# Patient Record
Sex: Male | Born: 1955 | Race: Black or African American | Hispanic: No | Smoking: Never smoker
Health system: Southern US, Community
[De-identification: ages and names within clinical notes are randomized; demographics above are authoritative.]

## PROBLEM LIST (undated history)

## (undated) DIAGNOSIS — C801 Malignant (primary) neoplasm, unspecified: Secondary | ICD-10-CM

## (undated) HISTORY — PX: TONSILLECTOMY: SUR1361

## (undated) HISTORY — PX: PROSTATE SURGERY: SHX751

---

## 1998-09-03 ENCOUNTER — Emergency Department (HOSPITAL_COMMUNITY): Admission: EM | Admit: 1998-09-03 | Discharge: 1998-09-03 | Payer: Self-pay | Admitting: Emergency Medicine

## 1999-01-13 ENCOUNTER — Emergency Department (HOSPITAL_COMMUNITY): Admission: EM | Admit: 1999-01-13 | Discharge: 1999-01-13 | Payer: Self-pay | Admitting: Pulmonary Disease

## 1999-09-03 ENCOUNTER — Emergency Department (HOSPITAL_COMMUNITY): Admission: EM | Admit: 1999-09-03 | Discharge: 1999-09-04 | Payer: Self-pay | Admitting: Emergency Medicine

## 2000-10-17 ENCOUNTER — Encounter: Payer: Self-pay | Admitting: Emergency Medicine

## 2000-10-17 ENCOUNTER — Emergency Department (HOSPITAL_COMMUNITY): Admission: EM | Admit: 2000-10-17 | Discharge: 2000-10-17 | Payer: Self-pay | Admitting: Emergency Medicine

## 2002-06-12 ENCOUNTER — Emergency Department (HOSPITAL_COMMUNITY): Admission: EM | Admit: 2002-06-12 | Discharge: 2002-06-12 | Payer: Self-pay | Admitting: *Deleted

## 2003-01-14 ENCOUNTER — Encounter: Payer: Self-pay | Admitting: Emergency Medicine

## 2003-01-14 ENCOUNTER — Emergency Department (HOSPITAL_COMMUNITY): Admission: EM | Admit: 2003-01-14 | Discharge: 2003-01-14 | Payer: Self-pay | Admitting: Emergency Medicine

## 2003-01-15 ENCOUNTER — Encounter: Payer: Self-pay | Admitting: Emergency Medicine

## 2003-01-15 ENCOUNTER — Emergency Department (HOSPITAL_COMMUNITY): Admission: EM | Admit: 2003-01-15 | Discharge: 2003-01-15 | Payer: Self-pay | Admitting: Emergency Medicine

## 2003-10-10 ENCOUNTER — Emergency Department (HOSPITAL_COMMUNITY): Admission: EM | Admit: 2003-10-10 | Discharge: 2003-10-10 | Payer: Self-pay | Admitting: Emergency Medicine

## 2008-12-01 ENCOUNTER — Ambulatory Visit (HOSPITAL_COMMUNITY): Admission: RE | Admit: 2008-12-01 | Discharge: 2008-12-01 | Payer: Self-pay | Admitting: Urology

## 2009-01-29 ENCOUNTER — Inpatient Hospital Stay (HOSPITAL_COMMUNITY): Admission: RE | Admit: 2009-01-29 | Discharge: 2009-01-30 | Payer: Self-pay | Admitting: Urology

## 2009-01-29 ENCOUNTER — Encounter (INDEPENDENT_AMBULATORY_CARE_PROVIDER_SITE_OTHER): Payer: Self-pay | Admitting: Urology

## 2009-10-19 ENCOUNTER — Emergency Department (HOSPITAL_COMMUNITY): Admission: EM | Admit: 2009-10-19 | Discharge: 2009-10-19 | Payer: Self-pay | Admitting: Emergency Medicine

## 2011-01-12 ENCOUNTER — Emergency Department (HOSPITAL_COMMUNITY)
Admission: EM | Admit: 2011-01-12 | Discharge: 2011-01-12 | Disposition: A | Discharge status: Home / Self Care | Payer: 59 | Attending: Emergency Medicine | Admitting: Emergency Medicine

## 2011-01-12 DIAGNOSIS — H5789 Other specified disorders of eye and adnexa: Secondary | ICD-10-CM | POA: Insufficient documentation

## 2011-01-12 DIAGNOSIS — H1130 Conjunctival hemorrhage, unspecified eye: Secondary | ICD-10-CM | POA: Insufficient documentation

## 2011-01-15 LAB — HEMOGLOBIN AND HEMATOCRIT, BLOOD
HCT: 43.1 % (ref 39.0–52.0)
Hemoglobin: 13.1 g/dL (ref 13.0–17.0)

## 2011-01-15 LAB — BASIC METABOLIC PANEL
BUN: 16 mg/dL (ref 6–23)
CO2: 31 mEq/L (ref 19–32)
Calcium: 9.2 mg/dL (ref 8.4–10.5)
Chloride: 105 mEq/L (ref 96–112)
Creatinine, Ser: 0.99 mg/dL (ref 0.4–1.5)
GFR calc Af Amer: >60 mL/min (ref >60)
GFR calc non Af Amer: >60 mL/min (ref >60)
Glucose, Bld: 100 mg/dL — ABNORMAL HIGH (ref 70–99)
Potassium: 3.9 mEq/L (ref 3.5–5.1)
Sodium: 142 mEq/L (ref 135–145)

## 2011-01-15 LAB — CBC
HCT: 43.4 % (ref 39.0–52.0)
Hemoglobin: 14.6 g/dL (ref 13.0–17.0)
MCHC: 33.7 g/dL (ref 30.0–36.0)
Platelets: 200 10*3/uL (ref 150–400)
RDW: 13.3 % (ref 11.5–15.5)

## 2011-02-18 NOTE — Op Note (Signed)
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   POSTOPERATIVE DIAGNOSIS:  Clinically localized adenocarcinoma of  prostate (clinical stage T2a N0 M0).   PROCEDURE:  1. Robotic-assisted laparoscopic radical prostatectomy (left nerve      sparing).  2. Bilateral laparoscopic pelvic lymphadenectomy.   SURGEON:  Dr. Heloise Purpura.   FIRST ASSISTANT:  Delia Chimes, nurse practitioner.   SECOND ASSISTANT:  Dr. Georgeanna Lea.   ANESTHESIA:  General.   COMPLICATIONS:  None.   ESTIMATED BLOOD LOSS:  225 mL.   INTRAVENOUS FLUIDS:  1000 mL of lactated Ringer's.   SPECIMENS:  1. Prostate and seminal vesicles.  2. Right pelvic lymph nodes.  3. Left pelvic lymph nodes.   DISPOSITION OF SPECIMENS:  To pathology.   DRAINS:  1. A 20-French coude catheter.  2. A #19 Blake pelvic drain.   INDICATION:  Cody [name] is a 55 year old gentleman who was found to have  an increasing PSA.  He had originally undergone a prostate biopsy in  2006 which was negative.  His PSA continued to increase over the past  few years, and he was reevaluated by Dr. Aldean Ast, and a repeat biopsy  indicated clinically localized adenocarcinoma of the prostate.  He did  have findings on a CT scan which suggested a small sclerotic focus in  the left ilium as well as in the sacrum and pubis.  However, these were  felt to represent benign bone islands, and bone scan did not demonstrate  uptake in these areas.  The potential risks, complications, and  alternative treatment options were discussed in detail, and  informed  consent was obtained.   DESCRIPTION OF PROCEDURE:  The patient was taken to the operating room,  and a general anesthetic was administered.  He was given preoperative  antibiotics, placed in the dorsal lithotomy position, and prepped and  draped in the usual sterile fashion.  Next, a preoperative time-out was  performed.  A Foley catheter was then inserted into the bladder.  A site  was selected just superior to the umbilicus for placement of the camera  port.  This was placed using a standard open Hassan technique which  allowed entry in the peritoneal cavity under direct vision without  difficulty.  A 12-mm port was then placed, and a pneumoperitoneum was  established.  A 0 degrees lens was used to inspect the abdomen, and  there was no evidence of any intra-abdominal injuries or other  abnormalities.  The remaining ports were then placed with bilateral 8-mm  robotic ports placed approximately 10 cm lateral to and just inferior to  the camera port site.  An additional 8-mm robotic port was placed  in the  far left lateral abdominal wall.  A 5-mm port was placed between the  camera port and the right robotic port, and a 12-mm port was placed in  the far right lateral abdominal wall for laparoscopic assistance.  All  ports were placed under direct vision without difficulty.  The surgical  cart was then docked.  With the aid of the cautery scissors, the bladder  was reflected posteriorly allowing entry into the space of Retzius and  identification of the endopelvic fascia and prostate.  The endopelvic  fascia was then incised from the apex back to the base of the prostate  bilaterally, and the underlying levator muscle fibers were swept  laterally off the prostate, thereby isolating the dorsal venous complex.  The dorsal vein was then stapled and divided with a 45-mm flex ETS  stapler.  The bladder neck was identified with the aid of Foley catheter  manipulation was divided  anteriorly, thereby exposing the catheter.  The  catheter balloon was deflated, and the catheter was brought into the  operative field and used to retract the prostate anteriorly.  This  exposed the posterior bladder neck which was then divided, and  dissection proceeded between the bladder neck and prostate until the  vasa deferentia and seminal vesicles were identified.  The vasa  deferentia were isolated, divided and lifted anteriorly.  The seminal  vesicles were then dissected down to their tips with care to control  seminal vesicle arterial blood supply.  There were then lifted  anteriorly, and the space between Denonvilliers fascia and the anterior  rectum was bluntly developed, thereby isolating the vascular pedicles of  the prostate.  On the left side, the lateral prostatic fascia was  incised, releasing the neurovascular bundle, and the pedicle was ligated  with Hem-o-lok clips above the level of the neurovascular bundles.  The  neurovascular bundle was released off the apex of prostate and urethra.  On the right side, a wide non-nerve sparing prostatectomy was performed,  with Hem-o-lok clips also used for hemostasis along the vascular  pedicle.  The urethra was then sharply divided allowing the prostate  specimen to be disarticulated.  The pelvis was copiously irrigated, and  there was no evidence for any rectal injury.  Hemostasis was ensured.  Attention then turned the right pelvic sidewall.  The fibrofatty tissue  between the external iliac vein, confluence of the iliac vessels,  hypogastric artery, and Cooper's ligament was dissected free from the  pelvic sidewall with care to preserve the obturator nerve.  Hem-o-lok  clips were used for lymphostasis and hemostasis.  An identical procedure  was performed on the contralateral side.  Both lymphatic packets were  removed for permanent pathologic analysis.  Attention then turned at the  urethral anastomosis.  A 2-0 Vicryl slip  knot was placed between  Denonvilliers fascia, the posterior bladder neck and posterior urethra  to reapproximate these structures.  A double-armed 3-0 Monocryl suture  was then used to perform a 360 degrees running tension-free anastomosis  between the bladder neck and urethra.  A new 20-French coude catheter  was inserted into the bladder and irrigated.  There were no blood clots  within the bladder, and the anastomosis appeared to be watertight.  A  #19 Blake drain was brought through the left robotic port and  appropriately positioned in the pelvis.  It was secured to skin with a  nylon suture.  The surgical cart was then docked.  With the aid  of the  suture passer device, the right lateral 12-mm port site was then closed  with a 0 Vicryl suture.  All remaining ports were removed under direct  vision.  The prostate specimen was removed intact via the periumbilical  port site within the Endopouch retrieval bag.  This fascial opening was  closed with a running 0  Vicryl suture.  All ports were injected with 0.25% Marcaine and  reapproximated at the skin level with staples.  Sterile dressings were  applied.  The patient appeared to tolerate procedure well without  complications.  He was able to be extubated and transferred to the  recovery unit in satisfactory condition.      Heloise Purpura, MD  Electronically Signed     LB/MEDQ  D:  01/29/2009  T:  01/29/2009  Job:  [id number]

## 2014-02-21 ENCOUNTER — Ambulatory Visit: Payer: 59

## 2015-02-05 ENCOUNTER — Ambulatory Visit
Admission: RE | Admit: 2015-02-05 | Discharge: 2015-02-05 | Disposition: A | Discharge status: Home / Self Care | Payer: 59 | Source: Ambulatory Visit | Attending: Family Medicine | Admitting: Family Medicine

## 2015-02-05 ENCOUNTER — Other Ambulatory Visit: Payer: Self-pay | Admitting: Family Medicine

## 2015-02-05 DIAGNOSIS — R1030 Lower abdominal pain, unspecified: Secondary | ICD-10-CM

## 2015-02-05 MED ORDER — IOHEXOL 300 MG/ML  SOLN
100.0000 mL | Freq: Once | INTRAMUSCULAR | Status: AC | PRN
  Administered 2015-02-05: 100 mL via INTRAVENOUS

## 2015-09-19 ENCOUNTER — Other Ambulatory Visit (HOSPITAL_COMMUNITY): Payer: Self-pay | Admitting: Urology

## 2015-09-19 DIAGNOSIS — C61 Malignant neoplasm of prostate: Secondary | ICD-10-CM

## 2015-10-03 ENCOUNTER — Encounter (HOSPITAL_COMMUNITY)
Admission: RE | Admit: 2015-10-03 | Discharge: 2015-10-03 | Disposition: A | Discharge status: Home / Self Care | Payer: 59 | Source: Ambulatory Visit | Attending: Urology | Admitting: Urology

## 2015-10-03 DIAGNOSIS — C61 Malignant neoplasm of prostate: Secondary | ICD-10-CM | POA: Insufficient documentation

## 2015-10-03 MED ORDER — TECHNETIUM TC 99M MEDRONATE IV KIT
25.7000 | PACK | Freq: Once | INTRAVENOUS | Status: AC | PRN
  Administered 2015-10-03: 25.7 via INTRAVENOUS

## 2015-12-10 DIAGNOSIS — B029 Zoster without complications: Secondary | ICD-10-CM | POA: Insufficient documentation

## 2015-12-10 DIAGNOSIS — B078 Other viral warts: Secondary | ICD-10-CM | POA: Insufficient documentation

## 2015-12-10 DIAGNOSIS — R61 Generalized hyperhidrosis: Secondary | ICD-10-CM | POA: Insufficient documentation

## 2015-12-10 DIAGNOSIS — N529 Male erectile dysfunction, unspecified: Secondary | ICD-10-CM | POA: Insufficient documentation

## 2016-05-08 ENCOUNTER — Encounter (HOSPITAL_COMMUNITY): Payer: Self-pay | Admitting: Emergency Medicine

## 2016-05-08 ENCOUNTER — Emergency Department (HOSPITAL_COMMUNITY): Payer: BLUE CROSS/BLUE SHIELD

## 2016-05-08 ENCOUNTER — Emergency Department (HOSPITAL_COMMUNITY)
Admission: EM | Admit: 2016-05-08 | Discharge: 2016-05-08 | Disposition: A | Discharge status: Home / Self Care | Payer: BLUE CROSS/BLUE SHIELD | Attending: Emergency Medicine | Admitting: Emergency Medicine

## 2016-05-08 DIAGNOSIS — Z8546 Personal history of malignant neoplasm of prostate: Secondary | ICD-10-CM | POA: Diagnosis not present

## 2016-05-08 DIAGNOSIS — Z791 Long term (current) use of non-steroidal anti-inflammatories (NSAID): Secondary | ICD-10-CM | POA: Diagnosis not present

## 2016-05-08 DIAGNOSIS — Z79899 Other long term (current) drug therapy: Secondary | ICD-10-CM | POA: Insufficient documentation

## 2016-05-08 DIAGNOSIS — N200 Calculus of kidney: Secondary | ICD-10-CM | POA: Diagnosis not present

## 2016-05-08 DIAGNOSIS — R1012 Left upper quadrant pain: Secondary | ICD-10-CM | POA: Diagnosis present

## 2016-05-08 HISTORY — DX: Malignant (primary) neoplasm, unspecified: C80.1

## 2016-05-08 LAB — COMPREHENSIVE METABOLIC PANEL
ALBUMIN: 4.4 g/dL (ref 3.5–5.0)
ALK PHOS: 87 U/L (ref 38–126)
ALT: 28 U/L (ref 17–63)
ANION GAP: 10 (ref 5–15)
AST: 32 U/L (ref 15–41)
BILIRUBIN TOTAL: 0.7 mg/dL (ref 0.3–1.2)
BUN: 22 mg/dL — ABNORMAL HIGH (ref 6–20)
CALCIUM: 9.4 mg/dL (ref 8.9–10.3)
CO2: 26 mmol/L (ref 22–32)
Chloride: 104 mmol/L (ref 101–111)
Creatinine, Ser: 1.19 mg/dL (ref 0.61–1.24)
GLUCOSE: 111 mg/dL — AB (ref 65–99)
POTASSIUM: 3.7 mmol/L (ref 3.5–5.1)
Sodium: 140 mmol/L (ref 135–145)
TOTAL PROTEIN: 7.8 g/dL (ref 6.5–8.1)

## 2016-05-08 LAB — URINALYSIS, ROUTINE W REFLEX MICROSCOPIC
BILIRUBIN URINE: NEGATIVE
Glucose, UA: NEGATIVE mg/dL
LEUKOCYTES UA: NEGATIVE
NITRITE: NEGATIVE
Protein, ur: NEGATIVE mg/dL
SPECIFIC GRAVITY, URINE: 1.028 (ref 1.005–1.030)
pH: 6 (ref 5.0–8.0)

## 2016-05-08 LAB — URINE MICROSCOPIC-ADD ON

## 2016-05-08 LAB — CBC
HEMATOCRIT: 39.5 % (ref 39.0–52.0)
HEMOGLOBIN: 13.5 g/dL (ref 13.0–17.0)
MCH: 29.6 pg (ref 26.0–34.0)
MCHC: 34.2 g/dL (ref 30.0–36.0)
MCV: 86.6 fL (ref 78.0–100.0)
Platelets: 229 10*3/uL (ref 150–400)
RBC: 4.56 MIL/uL (ref 4.22–5.81)
RDW: 12.6 % (ref 11.5–15.5)
WBC: 12.9 10*3/uL — AB (ref 4.0–10.5)

## 2016-05-08 LAB — LIPASE, BLOOD: Lipase: 13 U/L (ref 11–51)

## 2016-05-08 MED ORDER — MORPHINE SULFATE (PF) 4 MG/ML IV SOLN
4.0000 mg | INTRAVENOUS | Status: DC | PRN
  Administered 2016-05-08: 4 mg via INTRAVENOUS
  Filled 2016-05-08: qty 1

## 2016-05-08 MED ORDER — HYDROCODONE-ACETAMINOPHEN 5-325 MG PO TABS: 1.0000 | ORAL_TABLET | ORAL | 0 refills | Status: DC | PRN

## 2016-05-08 MED ORDER — TAMSULOSIN HCL 0.4 MG PO CAPS: 0.4000 mg | ORAL_CAPSULE | Freq: Every day | ORAL | 0 refills | Status: DC

## 2016-05-08 MED ORDER — TAMSULOSIN HCL 0.4 MG PO CAPS
0.4000 mg | ORAL_CAPSULE | Freq: Once | ORAL | Status: AC
  Administered 2016-05-08: 0.4 mg via ORAL
  Filled 2016-05-08: qty 1

## 2016-05-08 MED ORDER — ONDANSETRON 4 MG PO TBDP: 4.0000 mg | ORAL_TABLET | Freq: Three times a day (TID) | ORAL | 0 refills | Status: DC | PRN

## 2016-05-08 MED ORDER — ONDANSETRON HCL 4 MG/2ML IJ SOLN
4.0000 mg | Freq: Once | INTRAMUSCULAR | Status: AC
  Administered 2016-05-08: 4 mg via INTRAVENOUS
  Filled 2016-05-08: qty 2

## 2016-05-08 NOTE — Discharge Instructions (Signed)
Flomax once per day until you pass your kidney stone in your pain resolves.  Vicodin as needed for pain. Do not work or drive when taking.  Follow-up with [facility]. Call for an appointment if you have not pass the stone within the next 5 days.

## 2016-05-08 NOTE — ED Notes (Signed)
Patient transported to CT 

## 2016-05-08 NOTE — ED Provider Notes (Signed)
[facility] DEPT Provider Note   CSN: [id number] Arrival date & time: 05/08/16  1423  First Provider Contact:  First MD Initiated Contact with Patient 05/08/16 1613        History   Chief Complaint Chief Complaint  Patient presents with  . Abdominal Pain  . Flank Pain    HPI Cody [name] is a 60 y.o. male. He has h/o Prostate Ca s/p Ctx, last dose 02/2016.  Left flank and LUQ AP since 04:00am.  Nausea without vomiting. He states his pain was maximum intensity at onset awaken him from sleep this morning. Sever had a kidney stone. He denies any urinary symptoms. No noticeable gross hematuria.  His history of prostatectomy. His PSA began to rise and December. He was restarted on "injections every 4 months" according to his "cancer doctor". Last was in May  HPI  Past Medical History:  Diagnosis Date  . Cancer [facility])    Prostate    There are no active problems to display for this patient.   Past Surgical History:  Procedure Laterality Date  . PROSTATE SURGERY    . TONSILLECTOMY         Home Medications    Prior to Admission medications   Medication Sig Start Date End Date Taking? Authorizing Provider  acetaminophen (TYLENOL) 500 MG tablet Take 1,000 mg by mouth every 6 (six) hours as needed for moderate pain.   Yes Historical Provider, MD  cholecalciferol (VITAMIN D) 1000 units tablet Take 1,000 Units by mouth 2 (two) times daily.   Yes Historical Provider, MD  HYDROcodone-acetaminophen (NORCO/VICODIN) 5-325 MG tablet Take 1 tablet by mouth every 4 (four) hours as needed. 05/08/16   Tanna Furry, MD  ondansetron (ZOFRAN ODT) 4 MG disintegrating tablet Take 1 tablet (4 mg total) by mouth every 8 (eight) hours as needed for nausea. 05/08/16   Tanna Furry, MD  tamsulosin (FLOMAX) 0.4 MG CAPS capsule Take 1 capsule (0.4 mg total) by mouth daily. 05/08/16   Tanna Furry, MD    Family History No family history on file.  Social History Social History  Substance Use Topics  .  Smoking status: Never Smoker  . Smokeless tobacco: Never Used  . Alcohol use Yes     Allergies   Review of patient's allergies indicates no known allergies.   Review of Systems Review of Systems  Constitutional: Negative for appetite change, chills, diaphoresis, fatigue and fever.  HENT: Negative for mouth sores, sore throat and trouble swallowing.   Eyes: Negative for visual disturbance.  Respiratory: Negative for cough, chest tightness, shortness of breath and wheezing.   Cardiovascular: Negative for chest pain.  Gastrointestinal: Positive for abdominal pain and nausea. Negative for abdominal distention, diarrhea and vomiting.  Endocrine: Negative for polydipsia, polyphagia and polyuria.  Genitourinary: Positive for flank pain. Negative for dysuria, frequency and hematuria.  Musculoskeletal: Negative for gait problem.  Skin: Negative for color change, pallor and rash.  Neurological: Negative for dizziness, syncope, light-headedness and headaches.  Hematological: Does not bruise/bleed easily.  Psychiatric/Behavioral: Negative for behavioral problems and confusion.     Physical Exam Updated Vital Signs BP 149/79 (BP Location: Left Arm)   Pulse (!) 57   Temp 98.3 F (36.8 C) (Oral)   Resp 18   SpO2 100%   Physical Exam  Constitutional: He is oriented to person, place, and time. He appears well-developed and well-nourished. No distress.  HENT:  Head: Normocephalic.  Eyes: Conjunctivae are normal. Pupils are equal, round, and reactive to  light. No scleral icterus.  Neck: Normal range of motion. Neck supple. No thyromegaly present.  Cardiovascular: Normal rate and regular rhythm.  Exam reveals no gallop and no friction rub.   No murmur heard. Pulmonary/Chest: Effort normal and breath sounds normal. No respiratory distress. He has no wheezes. He has no rales.  Abdominal: Soft. Bowel sounds are normal. He exhibits no distension. There is no tenderness. There is no rebound.     Musculoskeletal: Normal range of motion.       Back:  Neurological: He is alert and oriented to person, place, and time.  Skin: Skin is warm and dry. No rash noted.  Psychiatric: He has a normal mood and affect. His behavior is normal.     ED Treatments / Results  Labs (all labs ordered are listed, but only abnormal results are displayed) Labs Reviewed  COMPREHENSIVE METABOLIC PANEL - Abnormal; Notable for the following:       Result Value   Glucose, Bld 111 (*)    BUN 22 (*)    All other components within normal limits  CBC - Abnormal; Notable for the following:    WBC 12.9 (*)    All other components within normal limits  URINALYSIS, ROUTINE W REFLEX MICROSCOPIC (NOT AT [facility]) - Abnormal; Notable for the following:    Hgb urine dipstick MODERATE (*)    Ketones, ur >80 (*)    All other components within normal limits  URINE MICROSCOPIC-ADD ON - Abnormal; Notable for the following:    Squamous Epithelial / LPF 0-5 (*)    Bacteria, UA RARE (*)    All other components within normal limits  LIPASE, BLOOD    EKG  EKG Interpretation None       Radiology Ct Renal Stone Study  Result Date: 05/08/2016 CLINICAL DATA:  LEFT upper quadrant abdominal pain, LEFT flank pain, onset of symptoms earlier today, nausea, history prostate cancer EXAM: CT ABDOMEN AND PELVIS WITHOUT CONTRAST TECHNIQUE: Multidetector CT imaging of the abdomen and pelvis was performed following the standard protocol without IV contrast. Sagittal and coronal MPR images reconstructed from axial data set. Oral contrast was administered. COMPARISON:  10/03/2015 FINDINGS: Lower chest:  Lung bases clear. Hepatobiliary: Multiple hepatic cysts largest lateral segment LEFT lobe liver 15 x 12 mm. Gallbladder and liver otherwise normal appearance Pancreas: Normal appearance Spleen: Normal appearance Adrenals/Urinary Tract: Mild thickening adrenal glands stable. Normal appearing RIGHT kidney and RIGHT ureter. Mild LEFT  hydronephrosis and hydroureter secondary to a 1-2 mm diameter calculus at the LEFT ureterovesical junction. Bladder otherwise unremarkable. Prostate gland surgically absent. Stomach/Bowel: Normal appearing retrocecal appendix. Small mild high density is seen within the stomach, small bowel and RIGHT colon, could represent contrast or radiodense medication. Vascular/Lymphatic: Atherosclerotic calcification in aorta and coronary arteries. Normal to upper normal sized external iliac lymph nodes stable. No definite abdominal or pelvic adenopathy. Reproductive: N/A Other: No free air or free fluid.  No hernia. Musculoskeletal: Bones unremarkable. IMPRESSION: LEFT hydronephrosis and hydroureter secondary to a 1-2 mm diameter LEFT UPJ calculus. Grossly stable hepatic cysts. No other definite intra-abdominal or intrapelvic abnormalities. Aortic atherosclerosis and coronary arterial calcification. Electronically Signed   By: Lavonia Dana M.D.   On: 05/08/2016 17:24    Procedures Procedures (including critical care time)  Medications Ordered in ED Medications  morphine 4 MG/ML injection 4 mg (4 mg Intravenous Given 05/08/16 1726)  tamsulosin (FLOMAX) capsule 0.4 mg (not administered)  ondansetron (ZOFRAN) injection 4 mg (4 mg Intravenous Given 05/08/16 1726)  Initial Impression / Assessment and Plan / ED Course  I have reviewed the triage vital signs and the nursing notes.  Pertinent labs & imaging results that were available during my care of the patient were reviewed by me and considered in my medical decision making (see chart for details).  Clinical Course    Left flank pain. No tenderness. Does not appear as symptomatic as I would expect with ureteral stone. However he does rate his pain as a "10 over 10". He has no reproducible tenderness. Clear lungs. Symptoms are clearly abdominal and not pulmonary. Not hypoxemic or tachycardic. Hematuria could be secondary to stone, infection, or known prostate  cancer, although patient has had prostatectomy.  He does not know if this was complete or cannot describe the nature of it to me.  Note: Per chart, a radical prostatetectomy with pelvic LND in 2012.  Final Clinical Impressions(s) / ED Diagnoses   Final diagnoses:  Kidney stone   Patient had complete relief of pain. CT scan shows small distal UVJ stone just at the far distal end of the ureter. Plan is home, given Flomax here. Push fluids stay hydrated. When necessary pain medication. Urological follow-up if not improving with his urologist Dr. Brayton Caves Prescriptions New Prescriptions   HYDROCODONE-ACETAMINOPHEN (NORCO/VICODIN) 5-325 MG TABLET    Take 1 tablet by mouth every 4 (four) hours as needed.   ONDANSETRON (ZOFRAN ODT) 4 MG DISINTEGRATING TABLET    Take 1 tablet (4 mg total) by mouth every 8 (eight) hours as needed for nausea.   TAMSULOSIN (FLOMAX) 0.4 MG CAPS CAPSULE    Take 1 capsule (0.4 mg total) by mouth daily.     Tanna Furry, MD 05/08/16 [phone]

## 2016-05-08 NOTE — ED Notes (Signed)
Attempted blood draw with no success.     

## 2016-05-08 NOTE — ED Triage Notes (Addendum)
Patient presents for LUQ abdominal pain and left flank pain starting earlier today. Reports nausea. Denies emesis, diarrhea, fever, or urinary symptoms. History of prostate CA, last chemo May 2017.

## 2016-09-19 ENCOUNTER — Ambulatory Visit (INDEPENDENT_AMBULATORY_CARE_PROVIDER_SITE_OTHER): Payer: BLUE CROSS/BLUE SHIELD | Admitting: Family Medicine

## 2016-09-19 ENCOUNTER — Encounter: Payer: Self-pay | Admitting: Family Medicine

## 2016-09-19 DIAGNOSIS — M79672 Pain in left foot: Secondary | ICD-10-CM

## 2016-09-19 DIAGNOSIS — M79671 Pain in right foot: Secondary | ICD-10-CM

## 2016-09-24 DIAGNOSIS — M79671 Pain in right foot: Secondary | ICD-10-CM | POA: Insufficient documentation

## 2016-09-24 DIAGNOSIS — M79672 Pain in left foot: Principal | ICD-10-CM

## 2016-09-24 NOTE — Progress Notes (Signed)
  Cody [name] - 60 y.o. male MRN [id number]  Date of birth: [birth year]    SUBJECTIVE:      Chief Complaint:/ HPI:   Bilateral foot pain for many years. Stands on his feet all day long. No previous foot injury or surgery Pain is diffuse, worse in forefoot. Some callous. No numbness or tingling.    ROS:     No unusual weigjht change.no other unusual arthralgias or myal;gias. No fever.  PERTINENT  PMH / PSH FH / / SH:  Past Medical, Surgical, Social, and Family History Reviewed & Updated in the EMR.  Pertinent findings include:  Prostate cancer on Lupron No personal hx DM  OBJECTIVE: BP (!) 131/59   Ht 5\' 10"  (1.778 m)   Wt 150 lb (68 kg)   BMI 21.52 kg/m   Physical Exam:  Vital signs are reviewed. WD WN NAD FEET Bilateral pes planus. Slight first ray deviation B. TTP plantar fascia origin area as well as MT heads area. NEURO intact sensation soft touych B feet. VASC dorsalis pedis pulses 2+ B=. SKIN multiple callous formations, esp medial edge of fdirst ray and first phalanx.  ASSESSMENT & PLAN:  See problem based charting & AVS for pt instructions.

## 2016-09-24 NOTE — Assessment & Plan Note (Signed)
Pes planus, some medial foot collapse, early bunion formation. Metatarsalgia nd some componenet of plantar fascia Will try insoles with scaphoid pads, X small MT pad on left and small MT pad on right. F/u 3-4 weeks---consider cusrtom molded orthotics

## 2016-10-17 ENCOUNTER — Encounter: Payer: Self-pay | Admitting: Family Medicine

## 2016-10-17 ENCOUNTER — Ambulatory Visit (INDEPENDENT_AMBULATORY_CARE_PROVIDER_SITE_OTHER): Payer: BLUE CROSS/BLUE SHIELD | Admitting: Family Medicine

## 2016-10-17 DIAGNOSIS — M79672 Pain in left foot: Secondary | ICD-10-CM | POA: Diagnosis not present

## 2016-10-17 DIAGNOSIS — M79671 Pain in right foot: Secondary | ICD-10-CM

## 2016-10-17 NOTE — Progress Notes (Signed)
  Cody [name] - 61 y.o. male MRN [id number]  Date of birth: 07/30/1956  SUBJECTIVE:  Including CC & ROS.   Cody [name] is a 61 year old male that is following up for bilateral foot pain. He was diagnosed with pes planus M metatarsalgia. He was placed in some green sport insoles. He reports that he has an 85% improvement of his pain. 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  ROS: No unexpected weight loss, fever, chills, swelling, instability, muscle pain, numbness/tingling, redness, otherwise see HPI    HISTORY: Past Medical, Surgical, Social, and Family History Reviewed & Updated per EMR.   Pertinent Historical Findings include: PMSHx -  Prostate cancer on lupron   DATA REVIEWED: None   PHYSICAL EXAM:  VS: BP:120/64  HR: bpm  TEMP: ( )  RESP:   HT:5\' 10"  (177.8 cm)   WT:150 lb (68 kg)  BMI:21.6 PHYSICAL EXAM: Gen: NAD, alert, cooperative with exam, well-appearing HEENT: clear conjunctiva, EOMI CV:  no edema, capillary refill brisk,  Resp: non-labored, normal speech Skin: no rashes, normal turgor  Neuro: no gross deficits.  Psych:  alert and oriented Feet:  Bilateral pes planus. No significant tenderness over the dorsal midfoot. Slight rate deviation bilaterally. Some tenderness to palpation of the plantar fascial origin. Neurovascularly intact  ASSESSMENT & PLAN:   Bilateral foot pain He was placed and custom orthotics today. Metatarsal pads were not place but these could be added at a later date if he feels that they're helpful to him.  Patient was fitted for a standard, cushioned, semi-rigid orthotic. The orthotic was heated and afterward the patient stood on the orthotic blank positioned on the orthotic stand. The patient was positioned in subtalar neutral position and 10 degrees of ankle dorsiflexion in a weight bearing stance. After completion of molding, a stable base was applied to the orthotic blank. The blank was  ground to a stable position for weight bearing. Size: 11 Base: Blue EVA Additional Posting and Padding: None The patient ambulated these, and they were very comfortable.  I spent 40 minutes with this patient, greater than 50% was face-to-face time counseling regarding the below diagnosis.

## 2016-10-17 NOTE — Assessment & Plan Note (Signed)
He was placed and custom orthotics today. Metatarsal pads were not place but these could be added at a later date if he feels that they're helpful to him.  Patient was fitted for a standard, cushioned, semi-rigid orthotic. The orthotic was heated and afterward the patient stood on the orthotic blank positioned on the orthotic stand. The patient was positioned in subtalar neutral position and 10 degrees of ankle dorsiflexion in a weight bearing stance. After completion of molding, a stable base was applied to the orthotic blank. The blank was ground to a stable position for weight bearing. Size: 11 Base: Blue EVA Additional Posting and Padding: None The patient ambulated these, and they were very comfortable.  I spent 40 minutes with this patient, greater than 50% was face-to-face time counseling regarding the below diagnosis.

## 2016-10-17 NOTE — Progress Notes (Signed)
[facility]: Attending Note: I have reviewed the chart, discussed wit the Sports Medicine Fellow. I agree with assessment and treatment plan as detailed in the [place] note. 80-90% improved already. Agree with custom molded inserts.

## 2017-01-07 ENCOUNTER — Encounter (HOSPITAL_COMMUNITY): Payer: Self-pay

## 2017-01-07 ENCOUNTER — Ambulatory Visit (HOSPITAL_COMMUNITY)
Admission: EM | Admit: 2017-01-07 | Discharge: 2017-01-07 | Disposition: A | Discharge status: Nursing Home (Includes ICF) | Payer: BLUE CROSS/BLUE SHIELD

## 2017-01-07 ENCOUNTER — Emergency Department (HOSPITAL_COMMUNITY)
Admission: EM | Admit: 2017-01-07 | Discharge: 2017-01-07 | Disposition: A | Discharge status: Home / Self Care | Payer: BLUE CROSS/BLUE SHIELD | Attending: Emergency Medicine | Admitting: Emergency Medicine

## 2017-01-07 ENCOUNTER — Emergency Department (HOSPITAL_COMMUNITY): Payer: BLUE CROSS/BLUE SHIELD

## 2017-01-07 DIAGNOSIS — R1031 Right lower quadrant pain: Secondary | ICD-10-CM | POA: Diagnosis present

## 2017-01-07 DIAGNOSIS — Z8546 Personal history of malignant neoplasm of prostate: Secondary | ICD-10-CM | POA: Diagnosis not present

## 2017-01-07 LAB — COMPREHENSIVE METABOLIC PANEL
ALBUMIN: 3.8 g/dL (ref 3.5–5.0)
ALK PHOS: 74 U/L (ref 38–126)
ALT: 29 U/L (ref 17–63)
ANION GAP: 8 (ref 5–15)
AST: 29 U/L (ref 15–41)
BUN: 14 mg/dL (ref 6–20)
CO2: 28 mmol/L (ref 22–32)
Calcium: 9.4 mg/dL (ref 8.9–10.3)
Chloride: 100 mmol/L — ABNORMAL LOW (ref 101–111)
Creatinine, Ser: 0.9 mg/dL (ref 0.61–1.24)
GFR calc Af Amer: >60 mL/min (ref >60)
GFR calc non Af Amer: >60 mL/min (ref >60)
Glucose, Bld: 109 mg/dL — ABNORMAL HIGH (ref 65–99)
POTASSIUM: 4.2 mmol/L (ref 3.5–5.1)
SODIUM: 136 mmol/L (ref 135–145)
Total Bilirubin: 0.6 mg/dL (ref 0.3–1.2)
Total Protein: 6.5 g/dL (ref 6.5–8.1)

## 2017-01-07 LAB — CBC
HEMATOCRIT: 36.1 % — AB (ref 39.0–52.0)
HEMOGLOBIN: 11.8 g/dL — AB (ref 13.0–17.0)
MCH: 28.7 pg (ref 26.0–34.0)
MCHC: 32.7 g/dL (ref 30.0–36.0)
MCV: 87.8 fL (ref 78.0–100.0)
Platelets: 238 10*3/uL (ref 150–400)
RBC: 4.11 MIL/uL — ABNORMAL LOW (ref 4.22–5.81)
RDW: 14.3 % (ref 11.5–15.5)
WBC: 9.7 10*3/uL (ref 4.0–10.5)

## 2017-01-07 LAB — URINALYSIS, ROUTINE W REFLEX MICROSCOPIC
Bilirubin Urine: NEGATIVE
Glucose, UA: NEGATIVE mg/dL
HGB URINE DIPSTICK: NEGATIVE
Ketones, ur: NEGATIVE mg/dL
Leukocytes, UA: NEGATIVE
Nitrite: NEGATIVE
PH: 8 (ref 5.0–8.0)
Protein, ur: NEGATIVE mg/dL
SPECIFIC GRAVITY, URINE: 1.014 (ref 1.005–1.030)

## 2017-01-07 LAB — LIPASE, BLOOD: Lipase: 18 U/L (ref 11–51)

## 2017-01-07 MED ORDER — IOPAMIDOL (ISOVUE-300) INJECTION 61%
INTRAVENOUS | Status: AC
  Administered 2017-01-07: 100 mL
  Filled 2017-01-07: qty 100

## 2017-01-07 MED ORDER — MORPHINE SULFATE (PF) 4 MG/ML IV SOLN
4.0000 mg | Freq: Once | INTRAVENOUS | Status: AC
  Administered 2017-01-07: 4 mg via INTRAVENOUS
  Filled 2017-01-07: qty 1

## 2017-01-07 MED ORDER — HYDROCODONE-ACETAMINOPHEN 5-325 MG PO TABS: 1.0000 | ORAL_TABLET | Freq: Four times a day (QID) | ORAL | 0 refills | Status: DC | PRN

## 2017-01-07 MED ORDER — ONDANSETRON HCL 4 MG/2ML IJ SOLN
4.0000 mg | Freq: Once | INTRAMUSCULAR | Status: AC
  Administered 2017-01-07: 4 mg via INTRAVENOUS
  Filled 2017-01-07: qty 2

## 2017-01-07 NOTE — ED Triage Notes (Signed)
Patient complains of RLQ pain that started yesterday. Pain worse with any movement. Denies fever, no nausea, no vomiting. NAD

## 2017-01-07 NOTE — ED Provider Notes (Signed)
[facility] DEPT Provider Note   CSN: [id number] Arrival date & time: 01/07/17  1132     History   Chief Complaint Chief Complaint  Patient presents with  . Abdominal Pain    HPI Cody [name] is a 61 y.o. male.  Patient presents to the ED with a chief complaint of RLQ pain that started yesterday.  He states that the pain has been gradually worsening.  He denies any radiating symptoms.  Denies any pain in his back or testicles.  He denies any fevers, chills, nausea, vomiting, or diarrhea.  He denies any dysuria or hematuria.  Has not taken anything for his symptoms.  Past surgical hx of remarkable for prostatectomy.   Denies any other complaints at this time.   The history is provided by the patient. No language interpreter was used.    Past Medical History:  Diagnosis Date  . Cancer [facility])    Prostate    Patient Active Problem List   Diagnosis Date Noted  . Bilateral foot pain 09/24/2016  . 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 Herpes zoster without complication 98/92/1194  . Excessive sweating 12/10/2015  . Common wart 12/10/2015    Past Surgical History:  Procedure Laterality Date  . PROSTATE SURGERY    . TONSILLECTOMY         Home Medications    Prior to Admission medications   Medication Sig Start Date End Date Taking? Authorizing Provider  tamsulosin (FLOMAX) 0.4 MG CAPS capsule Take 1 capsule (0.4 mg total) by mouth daily. Patient not taking: Reported on 01/07/2017 05/08/16   Tanna Furry, MD    Family History No family history on file.  Social History Social History  Substance Use Topics  . Smoking status: Never Smoker  . Smokeless tobacco: Never Used  . Alcohol use Yes     Allergies   Patient has no known allergies.   Review of Systems Review of Systems  Gastrointestinal: Positive for abdominal pain.  All other systems reviewed and are negative.    Physical Exam Updated Vital Signs BP (!) 142/79   Pulse (!) 48   Temp 98.1 F (36.7 C)  (Oral)   Resp 16   Ht 5\' 10"  (1.778 m)   Wt 66.2 kg   SpO2 100%   BMI 20.95 kg/m   Physical Exam  Constitutional: He is oriented to person, place, and time. He appears well-developed and well-nourished.  HENT:  Head: Normocephalic and atraumatic.  Eyes: Conjunctivae and EOM are normal. Pupils are equal, round, and reactive to light. Right eye exhibits no discharge. Left eye exhibits no discharge. No scleral icterus.  Neck: Normal range of motion. Neck supple. No JVD present.  Cardiovascular: Normal rate, regular rhythm and normal heart sounds.  Exam reveals no gallop and no friction rub.   No murmur heard. Pulmonary/Chest: Effort normal and breath sounds normal. No respiratory distress. He has no wheezes. He has no rales. He exhibits no tenderness.  Abdominal: Soft. He exhibits no distension and no mass. There is tenderness. There is no rebound and no guarding.  Moderate RLQ pain/right inguinal pain, no mass, no other focal abdominal pain  Musculoskeletal: Normal range of motion. He exhibits no edema or tenderness.  Neurological: He is alert and oriented to person, place, and time.  Skin: Skin is warm and dry.  Psychiatric: He has a normal mood and affect. His behavior is normal. Judgment and thought content normal.  Nursing note and vitals reviewed.    ED Treatments /  Results  Labs (all labs ordered are listed, but only abnormal results are displayed) Labs Reviewed  COMPREHENSIVE METABOLIC PANEL - Abnormal; Notable for the following:       Result Value   Chloride 100 (*)    Glucose, Bld 109 (*)    All other components within normal limits  CBC - Abnormal; Notable for the following:    RBC 4.11 (*)    Hemoglobin 11.8 (*)    HCT 36.1 (*)    All other components within normal limits  URINALYSIS, ROUTINE W REFLEX MICROSCOPIC - Abnormal; Notable for the following:    APPearance HAZY (*)    All other components within normal limits  LIPASE, BLOOD    EKG  EKG  Interpretation None       Radiology Ct Abdomen Pelvis W Contrast  Result Date: 01/07/2017 CLINICAL DATA:  Right lower quadrant pain. EXAM: CT ABDOMEN AND PELVIS WITH CONTRAST TECHNIQUE: Multidetector CT imaging of the abdomen and pelvis was performed using the standard protocol following bolus administration of intravenous contrast. CONTRAST:  150mL ISOVUE-300 IOPAMIDOL (ISOVUE-300) INJECTION 61% COMPARISON:  CT abdomen pelvis 05/08/2016, 12/01/2008 FINDINGS: Lower chest: No pulmonary nodules. No visible pleural or pericardial effusion. Hepatobiliary: There are hypodense lesions within the liver. The largest lesion, in the right hepatic lobe, now measures 1.8 cm, previously 1.4 cm. A lesion in the left hepatic lobe measures 1.8 x 1.1 cm, previously 1.5 x 1.1 cm. Comparison is somewhat compromised by the lack of IV contrast on the prior examination. No biliary dilatation or ascites. Normal gallbladder. Pancreas: Normal pancreatic contours and enhancement. No peripancreatic fluid collection or pancreatic ductal dilatation. Spleen: Normal. Adrenals/Urinary Tract: Normal adrenal glands. No hydronephrosis or solid renal mass. Stomach/Bowel: No abnormal bowel dilatation. No bowel wall thickening or adjacent fat stranding to indicate acute inflammation. No abdominal fluid collection. Normal appendix. Vascular/Lymphatic: There is atherosclerotic calcification of the non aneurysmal abdominal aorta. No abdominal or pelvic adenopathy. Reproductive: Unremarkable Musculoskeletal: No lytic or blastic osseous lesion. Normal visualized extrathoracic and extraperitoneal soft tissues. Other: No contributory non-categorized findings. IMPRESSION: 1. No acute abnormality of the abdomen or pelvis. 2. Slightly increased size of cystic lesions within the liver compared to 05/08/2016 but markedly increased compared to 12/01/2008. While benign hepatic cysts remain the favored consideration, given the change in size over time and the  patient's history of prostate cancer, MRI of the abdomen with and without contrast may be considered for further characterization on a nonemergent basis. Electronically Signed   By: Ulyses Jarred M.D.   On: 01/07/2017 17:09    Procedures Procedures (including critical care time)  Medications Ordered in ED Medications  morphine 4 MG/ML injection 4 mg (not administered)  ondansetron (ZOFRAN) injection 4 mg (not administered)     Initial Impression / Assessment and Plan / ED Course  I have reviewed the triage vital signs and the nursing notes.  Pertinent labs & imaging results that were available during my care of the patient were reviewed by me and considered in my medical decision making (see chart for details).     Patient with RLQ pain. Pain is only present when ambulating. Labs and vitals are reassuring.  Will check imaging or RLQ.  CT and labs are reassuring.  Etiology of patient's pain is unclear, however, I question whether he has some arthritis or overuse injury in his right hip which is causing his pain.  His VSS.  I do not believe any additional emergent workup is needed.  I  discussed his CT results and liver lesions with the patient.  He will follow-up on this with PCP.  Final Clinical Impressions(s) / ED Diagnoses   Final diagnoses:  Right lower quadrant abdominal pain    New Prescriptions New Prescriptions   HYDROCODONE-ACETAMINOPHEN (NORCO/VICODIN) 5-325 MG TABLET    Take 1-2 tablets by mouth every 6 (six) hours as needed.     Montine Circle, PA-C 01/07/17 [place], DO 01/07/17 1810

## 2017-01-07 NOTE — ED Notes (Signed)
Pt did not need anything at this time  

## 2017-01-07 NOTE — Discharge Instructions (Signed)
Your CT scan did not show any evidence of an emergent issue today. Your labs also look good.  It is unclear what is causing your pain.  Please follow-up with the group listed below.  Your CT scans shows lesions of the liver.  These appear to be unchanged from 2017, but have enlarged since 2010.  You may need an MRI on a non-emergent basis.  Please discuss this with you doctor.

## 2017-01-07 NOTE — ED Notes (Signed)
Patient transported to CT 

## 2017-01-07 NOTE — ED Notes (Signed)
Pt. Given morphine 1618 and d/c at this time. EDP recommending that he should wait 4 hours after morphine admin. To drive. Pt. States he has no one to pick him up. RN educated patient about how unsafe it is for him to drive after having narcotic IV medication. Pt. Insists no one can pick him up. Pt. States he will wait until 8pm to drive home. Pt. D/c to lobby.

## 2017-05-05 ENCOUNTER — Ambulatory Visit (INDEPENDENT_AMBULATORY_CARE_PROVIDER_SITE_OTHER): Payer: 59 | Admitting: Family Medicine

## 2017-05-05 ENCOUNTER — Encounter: Payer: Self-pay | Admitting: Family Medicine

## 2017-05-05 VITALS — BP 122/65 | HR 67 | Temp 97.8°F | Resp 17 | Ht 71.5 in | Wt 142.0 lb

## 2017-05-05 DIAGNOSIS — C61 Malignant neoplasm of prostate: Secondary | ICD-10-CM

## 2017-05-05 DIAGNOSIS — R739 Hyperglycemia, unspecified: Secondary | ICD-10-CM | POA: Diagnosis not present

## 2017-05-05 DIAGNOSIS — Z0000 Encounter for general adult medical examination without abnormal findings: Secondary | ICD-10-CM

## 2017-05-05 DIAGNOSIS — Z13220 Encounter for screening for lipoid disorders: Secondary | ICD-10-CM | POA: Diagnosis not present

## 2017-05-05 DIAGNOSIS — Z23 Encounter for immunization: Secondary | ICD-10-CM | POA: Diagnosis not present

## 2017-05-05 DIAGNOSIS — K7689 Other specified diseases of liver: Secondary | ICD-10-CM | POA: Diagnosis not present

## 2017-05-05 DIAGNOSIS — D649 Anemia, unspecified: Secondary | ICD-10-CM

## 2017-05-05 DIAGNOSIS — R61 Generalized hyperhidrosis: Secondary | ICD-10-CM

## 2017-05-05 DIAGNOSIS — Z131 Encounter for screening for diabetes mellitus: Secondary | ICD-10-CM | POA: Diagnosis not present

## 2017-05-05 MED ORDER — OXYBUTYNIN CHLORIDE 5 MG PO TABS: 5.0000 mg | ORAL_TABLET | Freq: Three times a day (TID) | ORAL | 0 refills | Status: DC

## 2017-05-05 NOTE — Patient Instructions (Addendum)
For prostate cancer, I would recommend scheduling appointment with Dr. Alinda Money. I will check PSA today, but can compare that to notes with Dr. Alinda Money.   I will recheck blood counts and schedule MRI for your liver cysts and abdominal pain, as well as anemia when seen in the ER.   Please call your gastroenterologist to schedule appointment to discuss the abdominal pain further. Let me know if a referral is needed.   For sweating, I can write for oxybutynin, but please follow up to discuss that further in next few weeks.  I will check other bloodwork in the meantime.   Schedule dentist appointment as planned.   Return to the clinic or go to the nearest emergency room if any of your symptoms worsen or new symptoms occur.  Keeping you healthy  Get these tests  Blood pressure- Have your blood pressure checked once a year by your healthcare provider.  Normal blood pressure is 120/80  Weight- Have your body mass index (BMI) calculated to screen for obesity.  BMI is a measure of body fat based on height and weight. You can also calculate your own BMI at ViewBanking.si.  Cholesterol- Have your cholesterol checked every year.  Diabetes- Have your blood sugar checked regularly if you have high blood pressure, high cholesterol, have a family history of diabetes or if you are overweight.  Screening for Colon Cancer- Colonoscopy starting at age 3.  Screening may begin sooner depending on your family history and other health conditions. Follow up colonoscopy as directed by your Gastroenterologist.  Screening for Prostate Cancer- Both blood work (PSA) and a rectal exam help screen for Prostate Cancer.  Screening begins at age 48 with African-American men and at age 4 with Caucasian men.  Screening may begin sooner depending on your family history.  Take these medicines  Aspirin- One aspirin daily can help prevent Heart disease and Stroke.  Flu shot- Every fall.  Tetanus- Every 10  years.  Zostavax- Once after the age of 4 to prevent Shingles.  Pneumonia shot- Once after the age of 30; if you are younger than 41, ask your healthcare provider if you need a Pneumonia shot.  Take these steps  Don't smoke- If you do smoke, talk to your doctor about quitting.  For tips on how to quit, go to www.smokefree.gov or call 1-800-QUIT-NOW.  Be physically active- Exercise 5 days a week for at least 30 minutes.  If you are not already physically active start slow and gradually work up to 30 minutes of moderate physical activity.  Examples of moderate activity include walking briskly, mowing the yard, dancing, swimming, bicycling, etc.  Eat a healthy diet- Eat a variety of healthy food such as fruits, vegetables, low fat milk, low fat cheese, yogurt, lean meant, poultry, fish, beans, tofu, etc. For more information go to www.thenutritionsource.org  Drink alcohol in moderation- Limit alcohol intake to less than two drinks a day. Never drink and drive.  Dentist- Brush and floss twice daily; visit your dentist twice a year.  Depression- Your emotional health is as important as your physical health. If you're feeling down, or losing interest in things you would normally enjoy please talk to your healthcare provider.  Eye exam- Visit your eye doctor every year.  Safe sex- If you may be exposed to a sexually transmitted infection, use a condom.  Seat belts- Seat belts can save your life; always wear one.  Smoke/Carbon Monoxide detectors- These detectors need to be installed on the appropriate  level of your home.  Replace batteries at least once a year.  Skin cancer- When out in the sun, cover up and use sunscreen 15 SPF or higher.  Violence- If anyone is threatening you, please tell your healthcare provider.  Living Will/ Health care power of attorney- Speak with your healthcare provider and family. Abdominal Pain, Adult Abdominal pain can be caused by many things. Often,  abdominal pain is not serious and it gets better with no treatment or by being treated at home. However, sometimes abdominal pain is serious. Your health care provider will do a medical history and a physical exam to try to determine the cause of your abdominal pain. Follow these instructions at home:  Take over-the-counter and prescription medicines only as told by your health care provider. Do not take a laxative unless told by your health care provider.  Drink enough fluid to keep your urine clear or pale yellow.  Watch your condition for any changes.  Keep all follow-up visits as told by your health care provider. This is important. Contact a health care provider if:  Your abdominal pain changes or gets worse.  You are not hungry or you lose weight without trying.  You are constipated or have diarrhea for more than 2-3 days.  You have pain when you urinate or have a bowel movement.  Your abdominal pain wakes you up at night.  Your pain gets worse with meals, after eating, or with certain foods.  You are throwing up and cannot keep anything down.  You have a fever. Get help right away if:  Your pain does not go away as soon as your health care provider told you to expect.  You cannot stop throwing up.  Your pain is only in areas of the abdomen, such as the right side or the left lower portion of the abdomen.  You have bloody or black stools, or stools that look like tar.  You have severe pain, cramping, or bloating in your abdomen.  You have signs of dehydration, such as: ? Dark urine, very little urine, or no urine. ? Cracked lips. ? Dry mouth. ? Sunken eyes. ? Sleepiness. ? Weakness. This information is not intended to replace advice given to you by your health care provider. Make sure you discuss any questions you have with your health care provider. Document Released: 07/02/2005 Document Revised: 04/11/2016 Document Reviewed: 03/05/2016 Elsevier Interactive  Patient Education  2017 [employer].     IF you received an x-ray today, you will receive an invoice from [facility]. Please contact [facility] at [phone] with questions or concerns regarding your invoice.   IF you received labwork today, you will receive an invoice from [place] [facility]. Please contact [facility] at [phone] with questions or concerns regarding your invoice.   Our billing staff will not be able to assist you with questions regarding bills from these companies.  You will be contacted with the lab results as soon as they are available. The fastest way to get your results is to activate your My Chart account. Instructions are located on the last page of this paperwork. If you have not heard from [place] regarding the results in 2 weeks, please contact this office.

## 2017-05-05 NOTE — Progress Notes (Signed)
Subjective:  This chart was scribed for Wendie Agreste, MD by Tamsen Roers, at [place] at [facility].  This patient was seen in room 11 and the patient's care was started at 9:22 AM.   Chief Complaint  Patient presents with  . Annual Exam     Patient ID: Cody [name], male    DOB: 1956/04/13, 61 y.o.   MRN: [id number]  HPI HPI Comments: Cody [name] is a 62 y.o. male who presents to Primary Care at [facility] for an annual physical exam.  Patient has a history of  erectile dysfunction and kidney stones. He is a new patient to me.    Abdominal Pain: In review of chart he was seen in the ED in April for abdominal pain. Borderline anemia 11.8, Normal lipase,  Normal CMP except for slight hyperglycemia: 109. He had a CT of abdomen and pelvis April 4th, increased size of cystic liver lesions. Recommended MRI of the abdomen as those had increased significantly since 2010. --- Patient would like an MRI scheduled for his abdomen. He has been having intermittent abdominal pain for about a year and states that some days are worse than others to the point where he is unable to sleep at night. He denies any constipation, diarrhea, hematochezia or difficulty with bowel movements.    Excessive sweating: Patient would like medication for his excessive sweating (Oxybutynin- 5 mg, two times per day) and states that this has really helped him in the past Without known side effects.. He has had sweating "forever" and does occasionally have night sweats.   He denies any fevers or side effects from this medication. Patient denies any unexpected weight loss.    Cancer screening: Colon cancer screening: coloscopy: Patient had a colonoscopy in May 2016.  He has not contacted a gastroenterologist regarding his abdominal pain.  Prostate cancer: He has a history of prostate cancer,treated by Dr. Alinda Money in 2010 with prostatectomy.--- His cancer had not metastasized at the time of his prostatectomy.  He  has not seen Dr. Alinda Money for about a year and was getting injections every four months when he was going to the office.  He was not able to continue getting his injections and stopped going.  He denies any difficulty urinating. Patient recently got a job with benefits and is planning to set up another appointment soon.    Immunizations:   There is no immunization history on file for this patient. Patient would like a tdap today.   Vision:  Patient has just gotten new glasses and saw his eye doctor in the past month.   Visual Acuity Screening   Right eye Left eye Both eyes  Without correction:     With correction: 20/20 20/20 20/20     Depression:  Depression screen [facility] 2/9 05/05/2017 10/17/2016  Decreased Interest 0 0  Down, Depressed, Hopeless 0 0  PHQ - 2 Score 0 0    Dentist: Patient will be seeing his dentist soon for some cavities and "bump like" areas which show up in his mouth occasionally.    Exercise: Patient goes on walks.   Patient Active Problem List   Diagnosis Date Noted  . Bilateral foot pain 09/24/2016  . 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 Herpes zoster without complication 04/04/1600  . Excessive sweating 12/10/2015  . Common wart 12/10/2015   Past Medical History:  Diagnosis Date  . Cancer [facility])    Prostate   Past Surgical History:  Procedure Laterality Date  .  PROSTATE SURGERY    . TONSILLECTOMY     No Known Allergies Prior to Admission medications   Medication Sig Start Date End Date Taking? Authorizing Provider  HYDROcodone-acetaminophen (NORCO/VICODIN) 5-325 MG tablet Take 1-2 tablets by mouth every 6 (six) hours as needed. 01/07/17   Montine Circle, PA-C  tamsulosin (FLOMAX) 0.4 MG CAPS capsule Take 1 capsule (0.4 mg total) by mouth daily. Patient not taking: Reported on 01/07/2017 05/08/16   Tanna Furry, MD   Social History   Social History  . Marital status: Legally Separated    Spouse name: N/A  . Number of children: N/A  . Years of education:  N/A   Occupational History  . Not on file.   Social History Main Topics  . Smoking status: Never Smoker  . Smokeless tobacco: Never Used  . Alcohol use Yes  . Drug use: No  . Sexual activity: No   Other Topics Concern  . Not on file   Social History Narrative  . No narrative on file      Review of Systems  Constitutional: Positive for diaphoresis.  HENT: Positive for dental problem.   All other systems reviewed and are negative.      Objective:   Physical Exam  Constitutional: He is oriented to person, place, and time. He appears well-developed and well-nourished.  Thin body.   HENT:  Head: Normocephalic and atraumatic.  Right Ear: External ear normal.  Left Ear: External ear normal.  Mouth/Throat: Oropharynx is clear and moist.  Some dental decay on the back side of his upper teeth, no current abscess seen. I do not see any surrounding gum erythema or edema.   Eyes: Pupils are equal, round, and reactive to light. Conjunctivae and EOM are normal.  Neck: Normal range of motion. Neck supple. No thyromegaly present.  Cardiovascular: Normal rate, regular rhythm, normal heart sounds and intact distal pulses.   Pulmonary/Chest: Effort normal and breath sounds normal. No respiratory distress. He has no wheezes.  Abdominal: Soft. He exhibits no distension. There is no tenderness. Hernia confirmed negative in the right inguinal area and confirmed negative in the left inguinal area.  Suprapubic tenderness.   Genitourinary:  Genitourinary Comments: Plan for DRE at urology.   Musculoskeletal: Normal range of motion. He exhibits no edema or tenderness.  Lymphadenopathy:    He has no cervical adenopathy.  Neurological: He is alert and oriented to person, place, and time. He has normal reflexes.  Skin: Skin is warm and dry.  Psychiatric: He has a normal mood and affect. His behavior is normal.  Vitals reviewed.   Vitals:   05/05/17 0843  BP: 122/65  Pulse: 67  Resp: 17    Temp: 97.8 F (36.6 C)  TempSrc: Oral  SpO2: 98%  Weight: 142 lb (64.4 kg)  Height: 5' 11.5" (1.816 m)       Assessment & Plan:    Cody [name] is a 61 y.o. male Annual physical exam  - -anticipatory guidance as below in AVS, screening labs above. Health maintenance items as above in HPI discussed/recommended as applicable.   Anemia, unspecified type - Plan: CBC  - Noted 4 months ago. Repeat CBC obtained.  Screening for hyperlipidemia - Plan: Lipid panel  Hyperglycemia - Plan: Comprehensive metabolic panel, Hemoglobin A1c Screening for diabetes mellitus - Plan: Comprehensive metabolic panel, Hemoglobin A1c  Prostate cancer ([place]) - Plan: PSA  -Check PSA, follow-up with urologist. Advised to schedule appointment.  Liver cyst - Plan: MR Abdomen W  Wo Contrast  -Obtain MRI for further evaluation of cyst as well as evaluation of other causes of abdominal pain. CBC obtained without signs of infection, afebrile, RTC precautions if worsening abdominal pain. CMP pending.  Excessive sweating - Plan: oxybutynin (DITROPAN) 5 MG tablet Night sweats  -PSA, CBC obtained for other causes night sweats, but long-standing history of excessive sweating per patient. Did agree to write for oxybutynin at this time, but advised to follow-up to discuss the symptoms further.  Need for Tdap vaccination - Plan: Tdap vaccine greater than or equal to 7yo IM   Meds ordered this encounter  Medications  . oxybutynin (DITROPAN) 5 MG tablet    Sig: Take 1 tablet (5 mg total) by mouth 3 (three) times daily.    Dispense:  60 tablet    Refill:  0   Patient Instructions   For prostate cancer, I would recommend scheduling appointment with Dr. Alinda Money. I will check PSA today, but can compare that to notes with Dr. Alinda Money.   I will recheck blood counts and schedule MRI for your liver cysts and abdominal pain, as well as anemia when seen in the ER.   Please call your gastroenterologist to schedule  appointment to discuss the abdominal pain further. Let me know if a referral is needed.   For sweating, I can write for oxybutynin, but please follow up to discuss that further in next few weeks.  I will check other bloodwork in the meantime.   Schedule dentist appointment as planned.   Return to the clinic or go to the nearest emergency room if any of your symptoms worsen or new symptoms occur.  Keeping you healthy  Get these tests  Blood pressure- Have your blood pressure checked once a year by your healthcare provider.  Normal blood pressure is 120/80  Weight- Have your body mass index (BMI) calculated to screen for obesity.  BMI is a measure of body fat based on height and weight. You can also calculate your own BMI at ViewBanking.si.  Cholesterol- Have your cholesterol checked every year.  Diabetes- Have your blood sugar checked regularly if you have high blood pressure, high cholesterol, have a family history of diabetes or if you are overweight.  Screening for Colon Cancer- Colonoscopy starting at age 70.  Screening may begin sooner depending on your family history and other health conditions. Follow up colonoscopy as directed by your Gastroenterologist.  Screening for Prostate Cancer- Both blood work (PSA) and a rectal exam help screen for Prostate Cancer.  Screening begins at age 52 with African-American men and at age 4 with Caucasian men.  Screening may begin sooner depending on your family history.  Take these medicines  Aspirin- One aspirin daily can help prevent Heart disease and Stroke.  Flu shot- Every fall.  Tetanus- Every 10 years.  Zostavax- Once after the age of 58 to prevent Shingles.  Pneumonia shot- Once after the age of 6; if you are younger than 51, ask your healthcare provider if you need a Pneumonia shot.  Take these steps  Don't smoke- If you do smoke, talk to your doctor about quitting.  For tips on how to quit, go to www.smokefree.gov or  call 1-800-QUIT-NOW.  Be physically active- Exercise 5 days a week for at least 30 minutes.  If you are not already physically active start slow and gradually work up to 30 minutes of moderate physical activity.  Examples of moderate activity include walking briskly, mowing the yard, dancing, swimming, bicycling,  etc.  Eat a healthy diet- Eat a variety of healthy food such as fruits, vegetables, low fat milk, low fat cheese, yogurt, lean meant, poultry, fish, beans, tofu, etc. For more information go to www.thenutritionsource.org  Drink alcohol in moderation- Limit alcohol intake to less than two drinks a day. Never drink and drive.  Dentist- Brush and floss twice daily; visit your dentist twice a year.  Depression- Your emotional health is as important as your physical health. If you're feeling down, or losing interest in things you would normally enjoy please talk to your healthcare provider.  Eye exam- Visit your eye doctor every year.  Safe sex- If you may be exposed to a sexually transmitted infection, use a condom.  Seat belts- Seat belts can save your life; always wear one.  Smoke/Carbon Monoxide detectors- These detectors need to be installed on the appropriate level of your home.  Replace batteries at least once a year.  Skin cancer- When out in the sun, cover up and use sunscreen 15 SPF or higher.  Violence- If anyone is threatening you, please tell your healthcare provider.  Living Will/ Health care power of attorney- Speak with your healthcare provider and family. Abdominal Pain, Adult Abdominal pain can be caused by many things. Often, abdominal pain is not serious and it gets better with no treatment or by being treated at home. However, sometimes abdominal pain is serious. Your health care provider will do a medical history and a physical exam to try to determine the cause of your abdominal pain. Follow these instructions at home:  Take over-the-counter and prescription  medicines only as told by your health care provider. Do not take a laxative unless told by your health care provider.  Drink enough fluid to keep your urine clear or pale yellow.  Watch your condition for any changes.  Keep all follow-up visits as told by your health care provider. This is important. Contact a health care provider if:  Your abdominal pain changes or gets worse.  You are not hungry or you lose weight without trying.  You are constipated or have diarrhea for more than 2-3 days.  You have pain when you urinate or have a bowel movement.  Your abdominal pain wakes you up at night.  Your pain gets worse with meals, after eating, or with certain foods.  You are throwing up and cannot keep anything down.  You have a fever. Get help right away if:  Your pain does not go away as soon as your health care provider told you to expect.  You cannot stop throwing up.  Your pain is only in areas of the abdomen, such as the right side or the left lower portion of the abdomen.  You have bloody or black stools, or stools that look like tar.  You have severe pain, cramping, or bloating in your abdomen.  You have signs of dehydration, such as: ? Dark urine, very little urine, or no urine. ? Cracked lips. ? Dry mouth. ? Sunken eyes. ? Sleepiness. ? Weakness. This information is not intended to replace advice given to you by your health care provider. Make sure you discuss any questions you have with your health care provider. Document Released: 07/02/2005 Document Revised: 04/11/2016 Document Reviewed: 03/05/2016 Elsevier Interactive Patient Education  2017 [employer].     IF you received an x-ray today, you will receive an invoice from [facility]. Please contact [facility] at [phone] with questions or concerns regarding your invoice.  IF you received labwork today, you will receive an invoice from [place]. Please contact [facility] at  [phone] with questions or concerns regarding your invoice.   Our billing staff will not be able to assist you with questions regarding bills from these companies.  You will be contacted with the lab results as soon as they are available. The fastest way to get your results is to activate your My Chart account. Instructions are located on the last page of this paperwork. If you have not heard from [place] regarding the results in 2 weeks, please contact this office.       I personally performed the services described in this documentation, which was scribed in my presence. The recorded information has been reviewed and considered for accuracy and completeness, addended by me as needed, and agree with information above.  Signed,   Merri Ray, MD Primary Care at [place].  05/07/17 2:26 PM

## 2017-05-06 LAB — COMPREHENSIVE METABOLIC PANEL
A/G RATIO: 1.8 (ref 1.2–2.2)
ALK PHOS: 96 IU/L (ref 39–117)
ALT: 16 IU/L (ref 0–44)
AST: 18 IU/L (ref 0–40)
Albumin: 4.6 g/dL (ref 3.6–4.8)
BILIRUBIN TOTAL: 0.2 mg/dL (ref 0.0–1.2)
BUN/Creatinine Ratio: 18 (ref 10–24)
BUN: 18 mg/dL (ref 8–27)
CO2: 29 mmol/L (ref 20–29)
Calcium: 9.9 mg/dL (ref 8.6–10.2)
Chloride: 99 mmol/L (ref 96–106)
Creatinine, Ser: 0.99 mg/dL (ref 0.76–1.27)
GFR calc Af Amer: 93 mL/min/{1.73_m2} (ref >59)
GFR calc non Af Amer: 81 mL/min/{1.73_m2} (ref >59)
GLOBULIN, TOTAL: 2.5 g/dL (ref 1.5–4.5)
Glucose: 110 mg/dL — ABNORMAL HIGH (ref 65–99)
Potassium: 5.2 mmol/L (ref 3.5–5.2)
SODIUM: 142 mmol/L (ref 134–144)
Total Protein: 7.1 g/dL (ref 6.0–8.5)

## 2017-05-06 LAB — CBC
HEMATOCRIT: 42.2 % (ref 37.5–51.0)
Hemoglobin: 14 g/dL (ref 13.0–17.7)
MCH: 28.3 pg (ref 26.6–33.0)
MCHC: 33.2 g/dL (ref 31.5–35.7)
MCV: 85 fL (ref 79–97)
Platelets: 246 10*3/uL (ref 150–379)
RBC: 4.95 x10E6/uL (ref 4.14–5.80)
RDW: 14.9 % (ref 12.3–15.4)
WBC: 8.5 10*3/uL (ref 3.4–10.8)

## 2017-05-06 LAB — LIPID PANEL
CHOLESTEROL TOTAL: 142 mg/dL (ref 100–199)
Chol/HDL Ratio: 3.2 ratio (ref 0.0–5.0)
HDL: 45 mg/dL (ref >39)
LDL CALC: 73 mg/dL (ref 0–99)
Triglycerides: 122 mg/dL (ref 0–149)
VLDL Cholesterol Cal: 24 mg/dL (ref 5–40)

## 2017-05-06 LAB — PSA: PROSTATE SPECIFIC AG, SERUM: 6.4 ng/mL — AB (ref 0.0–4.0)

## 2017-05-06 LAB — HEMOGLOBIN A1C
Est. average glucose Bld gHb Est-mCnc: 120 mg/dL
Hgb A1c MFr Bld: 5.8 % — ABNORMAL HIGH (ref 4.8–5.6)

## 2017-05-12 DIAGNOSIS — C61 Malignant neoplasm of prostate: Secondary | ICD-10-CM | POA: Diagnosis not present

## 2017-05-21 ENCOUNTER — Ambulatory Visit (INDEPENDENT_AMBULATORY_CARE_PROVIDER_SITE_OTHER): Payer: 59 | Admitting: Family Medicine

## 2017-05-21 ENCOUNTER — Encounter: Payer: Self-pay | Admitting: Family Medicine

## 2017-05-21 VITALS — BP 118/69 | HR 94 | Temp 98.2°F | Resp 14 | Ht 71.0 in | Wt 136.0 lb

## 2017-05-21 DIAGNOSIS — R972 Elevated prostate specific antigen [PSA]: Secondary | ICD-10-CM

## 2017-05-21 DIAGNOSIS — R61 Generalized hyperhidrosis: Secondary | ICD-10-CM

## 2017-05-21 DIAGNOSIS — R1084 Generalized abdominal pain: Secondary | ICD-10-CM

## 2017-05-21 NOTE — Progress Notes (Signed)
Subjective:  By signing my name below, I, [name], attest that this documentation has been prepared under the direction and in the presence of Merri Ray, MD. Electronically Signed: [name], [address]. 05/21/2017 , 11:58 AM .  Patient was seen in Room 26 .   Patient ID: Cody [name], male    DOB: [birth year], 61 y.o.   MRN: [id number] Chief Complaint  Patient presents with  . Follow-up    sweating, abd pain, labs   HPI Cody [name] is a 61 y.o. male  Here for follow up. He was last seen on July 31st for physical exam with multiple other concerns including abdominal pain. I recommended that he follow up with GI to discuss abdominal pain further. He has scheduled MRI for history of liver cysts, which has not yet been performed. He had a repeat CBC with normalization of hemoglobin on July 31st. His CMP was also reassuring.   He notes his abdominal pain has improved. He hasn't scheduled an appointment with his GI yet.   Elevated PSA His PSA was 6.4 on July 31st. Per telephone note, he had contacted urology, and plan on seeing him back after blood work performed in their office.   Patient states he's waiting for insurance to restart his Lupron injections. His urologist is Dr. Alinda Money. He has an appointment on Nov 12th.   Sweating He has used oxybutynin 5mg  TID prn; re-prescribed last visit. He states this has greatly improved his sweating. He denies side effects with this medication.   Patient Active Problem List   Diagnosis Date Noted  . Bilateral foot pain 09/24/2016  . 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 Herpes zoster without complication 41/74/0814  . Excessive sweating 12/10/2015  . Common wart 12/10/2015   Past Medical History:  Diagnosis Date  . Cancer [facility])    Prostate   Past Surgical History:  Procedure Laterality Date  . PROSTATE SURGERY    . TONSILLECTOMY     No Known Allergies Prior to Admission medications   Medication Sig Start Date End Date  Taking? Authorizing Provider  oxybutynin (DITROPAN) 5 MG tablet Take 1 tablet (5 mg total) by mouth 3 (three) times daily. 05/05/17  Yes Wendie Agreste, MD  HYDROcodone-acetaminophen (NORCO/VICODIN) 5-325 MG tablet Take 1-2 tablets by mouth every 6 (six) hours as needed. Patient not taking: Reported on 05/05/2017 01/07/17   Montine Circle, PA-C  tamsulosin (FLOMAX) 0.4 MG CAPS capsule Take 1 capsule (0.4 mg total) by mouth daily. Patient not taking: Reported on 01/07/2017 05/08/16   Tanna Furry, MD   Social History   Social History  . Marital status: Legally Separated    Spouse name: N/A  . Number of children: N/A  . Years of education: N/A   Occupational History  . Not on file.   Social History Main Topics  . Smoking status: Never Smoker  . Smokeless tobacco: Never Used  . Alcohol use Yes  . Drug use: No  . Sexual activity: No   Other Topics Concern  . Not on file   Social History Narrative  . No narrative on file   Review of Systems  Constitutional: Negative for fatigue and unexpected weight change.  Eyes: Negative for visual disturbance.  Respiratory: Negative for cough, chest tightness and shortness of breath.   Cardiovascular: Negative for chest pain, palpitations and leg swelling.  Gastrointestinal: Negative for abdominal pain and blood in stool.  Genitourinary: Negative for difficulty urinating.  Neurological: Negative for dizziness, light-headedness and  headaches.       Objective:   Physical Exam  Constitutional: He is oriented to person, place, and time. He appears well-developed and well-nourished. No distress.  HENT:  Head: Normocephalic and atraumatic.  Eyes: Pupils are equal, round, and reactive to light. EOM are normal.  Neck: Neck supple.  Cardiovascular: Normal rate.   Pulmonary/Chest: Effort normal. No respiratory distress.  Abdominal: Soft. Bowel sounds are normal. He exhibits no distension. There is no tenderness.  Musculoskeletal: Normal range of  motion.  Neurological: He is alert and oriented to person, place, and time.  Skin: Skin is warm and dry.  Psychiatric: He has a normal mood and affect. His behavior is normal.  Nursing note and vitals reviewed.   Vitals:   05/21/17 1050  BP: 118/69  Pulse: 94  Resp: 14  Temp: 98.2 F (36.8 C)  TempSrc: Oral  SpO2: 99%  Weight: 136 lb (61.7 kg)  Height: 5\' 11"  (1.803 m)      Assessment & Plan:   Cody [name] is a 61 y.o. male Excessive sweating  - Improved with restarting oxybutynin. Potential side effects, anticholinergic side effects discussed, currently asymptomatic. No changes for now.  Abdominal pain, generalized  - MRI pending as above. Pain has improved. Received message from patient after visit that his previous gastroenterologist was Dr. Carol Ada. Will consider referral to Dr. Benson Norway once we have the MRI results, possibly sooner if worsening abdominal pain.  Elevated PSA  -Followed by urology, Dr. Alinda Money. Apparently they have testing in place, and will proceed with restarting injections.  No orders of the defined types were placed in this encounter.  Patient Instructions   Let me know who your previous gastroenterologist was, and if abdominal pain persists or any concerns on her MRI, I can refer you there.  Continue follow-up with Dr. Lynne Logan office, let me know if I can help. Otherwise I will see you in the next 6 months.    IF you received an x-ray today, you will receive an invoice from [facility]. Please contact [facility] at [phone] with questions or concerns regarding your invoice.   IF you received labwork today, you will receive an invoice from [place]. Please contact [facility] at [phone] with questions or concerns regarding your invoice.   Our billing staff will not be able to assist you with questions regarding bills from these companies.  You will be contacted with the lab results as soon as they are available. The  fastest way to get your results is to activate your My Chart account. Instructions are located on the last page of this paperwork. If you have not heard from [place] regarding the results in 2 weeks, please contact this office.       I personally performed the services described in this documentation, which was scribed in my presence. The recorded information has been reviewed and considered for accuracy and completeness, addended by me as needed, and agree with information above.  Signed,   Merri Ray, MD Primary Care at [place].  05/22/17 1:49 PM

## 2017-05-21 NOTE — Patient Instructions (Addendum)
Let me know who your previous gastroenterologist was, and if abdominal pain persists or any concerns on her MRI, I can refer you there.  Continue follow-up with Dr. Lynne Logan office, let me know if I can help. Otherwise I will see you in the next 6 months.    IF you received an x-ray today, you will receive an invoice from [facility]. Please contact [facility]iology at [phone] with questions or concerns regarding your invoice.   IF you received labwork today, you will receive an invoice from [place]. Please contact [facility] at [phone] with questions or concerns regarding your invoice.   Our billing staff will not be able to assist you with questions regarding bills from these companies.  You will be contacted with the lab results as soon as they are available. The fastest way to get your results is to activate your My Chart account. Instructions are located on the last page of this paperwork. If you have not heard from [place] regarding the results in 2 weeks, please contact this office.

## 2017-05-22 ENCOUNTER — Encounter: Payer: Self-pay | Admitting: Family Medicine

## 2017-05-23 ENCOUNTER — Ambulatory Visit
Admission: RE | Admit: 2017-05-23 | Discharge: 2017-05-23 | Disposition: A | Discharge status: Home / Self Care | Payer: 59 | Source: Ambulatory Visit | Attending: Family Medicine | Admitting: Family Medicine

## 2017-05-23 DIAGNOSIS — K7689 Other specified diseases of liver: Secondary | ICD-10-CM | POA: Diagnosis not present

## 2017-05-23 MED ORDER — GADOBENATE DIMEGLUMINE 529 MG/ML IV SOLN
13.0000 mL | Freq: Once | INTRAVENOUS | Status: AC | PRN
  Administered 2017-05-23: 13 mL via INTRAVENOUS

## 2017-06-15 ENCOUNTER — Other Ambulatory Visit: Payer: Self-pay | Admitting: Family Medicine

## 2017-06-15 ENCOUNTER — Encounter: Payer: Self-pay | Admitting: Family Medicine

## 2017-06-15 DIAGNOSIS — R61 Generalized hyperhidrosis: Secondary | ICD-10-CM

## 2017-06-18 ENCOUNTER — Encounter: Payer: Self-pay | Admitting: Family Medicine

## 2017-06-20 MED ORDER — OXYBUTYNIN CHLORIDE 5 MG PO TABS: 5.0000 mg | ORAL_TABLET | Freq: Three times a day (TID) | ORAL | 0 refills | Status: DC

## 2017-07-02 DIAGNOSIS — Z5111 Encounter for antineoplastic chemotherapy: Secondary | ICD-10-CM | POA: Diagnosis not present

## 2017-07-02 DIAGNOSIS — C61 Malignant neoplasm of prostate: Secondary | ICD-10-CM | POA: Diagnosis not present

## 2017-08-06 ENCOUNTER — Other Ambulatory Visit: Payer: Self-pay | Admitting: Family Medicine

## 2017-08-06 DIAGNOSIS — R61 Generalized hyperhidrosis: Secondary | ICD-10-CM

## 2017-08-08 MED ORDER — OXYBUTYNIN CHLORIDE 5 MG PO TABS: 5.0000 mg | ORAL_TABLET | Freq: Three times a day (TID) | ORAL | 0 refills | Status: DC

## 2017-09-24 ENCOUNTER — Other Ambulatory Visit: Payer: Self-pay | Admitting: Family Medicine

## 2017-09-24 DIAGNOSIS — R61 Generalized hyperhidrosis: Secondary | ICD-10-CM

## 2017-09-24 MED ORDER — OXYBUTYNIN CHLORIDE 5 MG PO TABS: 5.0000 mg | ORAL_TABLET | Freq: Three times a day (TID) | ORAL | 0 refills | Status: DC

## 2017-11-16 ENCOUNTER — Other Ambulatory Visit: Payer: Self-pay | Admitting: Family Medicine

## 2017-11-16 DIAGNOSIS — R61 Generalized hyperhidrosis: Secondary | ICD-10-CM

## 2017-11-18 ENCOUNTER — Encounter: Payer: Self-pay | Admitting: Family Medicine

## 2017-11-18 DIAGNOSIS — R61 Generalized hyperhidrosis: Secondary | ICD-10-CM

## 2017-11-18 MED ORDER — OXYBUTYNIN CHLORIDE 5 MG PO TABS: 5.0000 mg | ORAL_TABLET | Freq: Three times a day (TID) | ORAL | 0 refills | Status: DC

## 2017-11-20 ENCOUNTER — Ambulatory Visit: Payer: 59 | Admitting: Family Medicine

## 2017-11-20 ENCOUNTER — Encounter: Payer: Self-pay | Admitting: Family Medicine

## 2017-11-20 VITALS — BP 133/82 | HR 65 | Temp 98.4°F | Resp 18 | Ht 71.0 in | Wt 152.8 lb

## 2017-11-20 DIAGNOSIS — Z8546 Personal history of malignant neoplasm of prostate: Secondary | ICD-10-CM

## 2017-11-20 DIAGNOSIS — D2222 Melanocytic nevi of left ear and external auricular canal: Secondary | ICD-10-CM

## 2017-11-20 DIAGNOSIS — R61 Generalized hyperhidrosis: Secondary | ICD-10-CM

## 2017-11-20 DIAGNOSIS — L74510 Primary focal hyperhidrosis, axilla: Secondary | ICD-10-CM | POA: Diagnosis not present

## 2017-11-20 MED ORDER — OXYBUTYNIN CHLORIDE 5 MG PO TABS: 5.0000 mg | ORAL_TABLET | Freq: Three times a day (TID) | ORAL | 1 refills | Status: DC

## 2017-11-20 NOTE — Patient Instructions (Addendum)
Sweating may be due to the lupron injections - I would talk to your urologist about those symptoms. Ok to continue oxybutinin 3 times per day.  Use of deodorant with 20% aluminum may also be helpful. If these treatments do not help, then other options can be discussed with dermatology.   I will refer you to dermatology to evaluate the lesion on your left ear. They can decide on biopsy/removal.   If you continue to have night sweats in next week to 10 days - please return for other testing. If you have fevers or getting worse sooner - please return sooner.  Return to the clinic or go to the nearest emergency room if any of your symptoms worsen or new symptoms occur.    IF you received an x-ray today, you will receive an invoice from [facility]. Please contact [facility]iology at [phone] with questions or concerns regarding your invoice.   IF you received labwork today, you will receive an invoice from [place]. Please contact [facility] at [phone] with questions or concerns regarding your invoice.   Our billing staff will not be able to assist you with questions regarding bills from these companies.  You will be contacted with the lab results as soon as they are available. The fastest way to get your results is to activate your My Chart account. Instructions are located on the last page of this paperwork. If you have not heard from [place] regarding the results in 2 weeks, please contact this office.

## 2017-11-20 NOTE — Progress Notes (Addendum)
Subjective:  By signing my name below, I, Cody [name], attest that this documentation has been prepared under the direction and in the presence of Merri Ray, MD. Electronically Signed: Moises [name], [place]. 11/20/2017 , 2:50 PM .  Patient was seen in Room 9 .   Patient ID: Cody [name], male    DOB: [birth year], 62 y.o.   MRN: [id number] Chief Complaint  Patient presents with  . Medication Refill    Oxybutynin   HPI Cody [name] is a 62 y.o. male Last seen in August 2018.   Elevated PSA He has a history of elevated PSA with history of prostate cancer, followed by urology. He had a radical prostatectomy in April 2010. Then, his PSA's has been increasing since that time. He was treated with Lupron for androgen deprivation. His urologist is Dr. Alinda Money; planned on appointment last Nov.   He's on Lupron injection, receives once every 4 months; next injection next month; he's been back on it for about 8 months now.   Excessive sweating He has used oxybutynin 5mg  TID PRN, which had improved his sweating. He denied any new sweating when discussed in August.   He reports excessive sweating in his armpits. He states he's been sweating more, even while on medication, which he's been taking TID daily. He denies any fever. Although, he's had occasional night sweats recently where he's soaked his sheets. He's tried clinical-strength deodorant in the past without any relief. He's currently applying deodorant twice a day. He denies dizziness or lightheadedness with oxybutynin. He denies cough, shortness of breath or any abdominal symptoms.   Bump in his left ear Patient also mentions noticing a bump in his left ear that started up 3 months ago. He states the bump would grow a "head" and fall off over time, newest head grown about 2 weeks ago. He denies working outdoors. He hasn't seen dermatologist in a long time.   Patient Active Problem List   Diagnosis Date Noted  . Bilateral foot  pain 09/24/2016  . 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 Herpes zoster without complication 28/31/5176  . Excessive sweating 12/10/2015  . Common wart 12/10/2015   Past Medical History:  Diagnosis Date  . Cancer [facility])    Prostate   Past Surgical History:  Procedure Laterality Date  . PROSTATE SURGERY    . TONSILLECTOMY     No Known Allergies Prior to Admission medications   Medication Sig Start Date End Date Taking? Authorizing Provider  oxybutynin (DITROPAN) 5 MG tablet Take 1 tablet (5 mg total) by mouth 3 (three) times daily. Office visit needed 11/18/17  Yes Wendie Agreste, MD   Social History   Socioeconomic History  . Marital status: Legally Separated    Spouse name: Not on file  . Number of children: Not on file  . Years of education: Not on file  . Highest education level: Not on file  Social Needs  . Financial resource strain: Not on file  . Food insecurity - worry: Not on file  . Food insecurity - inability: Not on file  . Transportation needs - medical: Not on file  . Transportation needs - non-medical: Not on file  Occupational History  . Not on file  Tobacco Use  . Smoking status: Never Smoker  . Smokeless tobacco: Never Used  Substance and Sexual Activity  . Alcohol use: Yes  . Drug use: No  . Sexual activity: No  Other Topics Concern  . Not on file  Social History Narrative  . Not on file   Review of Systems  Constitutional: Negative for chills, fatigue, fever and unexpected weight change.       Night sweats  Eyes: Negative for visual disturbance.  Respiratory: Negative for cough, chest tightness and shortness of breath.   Cardiovascular: Negative for chest pain, palpitations and leg swelling.  Gastrointestinal: Negative for abdominal pain and [name] in stool.  Skin:       Bump on left ear Excessive sweating (armpits)  Neurological: Negative for dizziness, light-headedness and headaches.       Objective:   Physical Exam  Constitutional: He is  oriented to person, place, and time. He appears well-developed and well-nourished. No distress.  HENT:  Head: Normocephalic and atraumatic.  Left ear: there's an exophytic lesion on the anterior helix of the external left ear, hyperpigmented, minimal scaling, no surrounding erythema, no discharge, measures about 57mm across  Eyes: EOM are normal. Pupils are equal, round, and reactive to light.  Neck: Neck supple.  Cardiovascular: Normal rate.  Pulmonary/Chest: Effort normal. No respiratory distress.  Musculoskeletal: Normal range of motion.  Neurological: He is alert and oriented to person, place, and time.  Skin: Skin is warm and dry.  Psychiatric: He has a normal mood and affect. His behavior is normal.  Nursing note and vitals reviewed.   Vitals:   11/20/17 1403 11/20/17 1459  BP: (!) 158/74 133/82  Pulse: 65   Resp: 18   Temp: 98.4 F (36.9 C)   TempSrc: Oral   SpO2: 98%   Weight: 152 lb 12.8 oz (69.3 kg)   Height: 5\' 11"  (1.803 m)       Assessment & Plan:   Cody [name] is a 62 y.o. male Hyperhidrosis of axilla - Plan: Ambulatory referral to Dermatology Excessive sweating - Plan: oxybutynin (DITROPAN) 5 MG tablet  - History of axillary hyperhidrosis. Slight worsening recently. Episodic sweating may also be related to Lupron injections, including recent night sweats. Denies fever, other localized symptoms.  -Tolerating oxybutynin, continue same dose, recommended deodorant with 20% aluminum, will be referring to dermatology for issue below. Could consider Botox thermolysis, oro other topical treatment to discuss with dermatology.  History of prostate cancer  -Now under continued care of urologist with repeat Lupron injections. Can discuss excessive sweating with his urologist. Continue routine follow-up with urologist  Nevus of left ear - Plan: Ambulatory referral to Dermatology  -Onset 3 months ago. Differential includes squamous cell with reported irritation and  bleeding. Refer to dermatology for evaluation and likely removal/biopsy.  Meds ordered this encounter  Medications  . oxybutynin (DITROPAN) 5 MG tablet    Sig: Take 1 tablet (5 mg total) by mouth 3 (three) times daily.    Dispense:  270 tablet    Refill:  1   Patient Instructions   Sweating may be due to the lupron injections - I would talk to your urologist about those symptoms. Ok to continue oxybutinin 3 times per day.  Use of deodorant with 20% aluminum may also be helpful. If these treatments do not help, then other options can be discussed with dermatology.   I will refer you to dermatology to evaluate the lesion on your left ear. They can decide on biopsy/removal.   If you continue to have night sweats in next week to 10 days - please return for other testing. If you have fevers or getting worse sooner - please return sooner.  Return to the clinic or go to  the nearest emergency room if any of your symptoms worsen or new symptoms occur.    IF you received an x-ray today, you will receive an invoice from [facility]. Please contact [facility] at [phone] with questions or concerns regarding your invoice.   IF you received labwork today, you will receive an invoice from [place]. Please contact [facility] at [phone] with questions or concerns regarding your invoice.   Our billing staff will not be able to assist you with questions regarding bills from these companies.  You will be contacted with the lab results as soon as they are available. The fastest way to get your results is to activate your My Chart account. Instructions are located on the last page of this paperwork. If you have not heard from [place] regarding the results in 2 weeks, please contact this office.       I personally performed the services described in this documentation, which was scribed in my presence. The recorded information has been reviewed and considered for accuracy and  completeness, addended by me as needed, and agree with information above.  Signed,   Merri Ray, MD [facility] [place].  11/20/17 2:55 PM

## 2017-11-23 ENCOUNTER — Encounter: Payer: Self-pay | Admitting: Family Medicine

## 2017-11-23 DIAGNOSIS — C61 Malignant neoplasm of prostate: Secondary | ICD-10-CM | POA: Diagnosis not present

## 2017-11-25 ENCOUNTER — Encounter: Payer: Self-pay | Admitting: Family Medicine

## 2017-11-26 NOTE — Telephone Encounter (Signed)
Can we please check into his issue with denial of meds? Thanks.  -JG

## 2017-12-07 ENCOUNTER — Encounter: Payer: Self-pay | Admitting: Family Medicine

## 2017-12-07 NOTE — Telephone Encounter (Signed)
Please see recent emails, and check into coverage issue for oxybutynin. This was routed to prior authorization pool on 2/21. Can we call pharmacy and see what is needed to get this covered? Thanks.

## 2017-12-09 DIAGNOSIS — R232 Flushing: Secondary | ICD-10-CM | POA: Diagnosis not present

## 2017-12-09 DIAGNOSIS — C61 Malignant neoplasm of prostate: Secondary | ICD-10-CM | POA: Diagnosis not present

## 2017-12-09 DIAGNOSIS — Z5111 Encounter for antineoplastic chemotherapy: Secondary | ICD-10-CM | POA: Diagnosis not present

## 2017-12-09 NOTE — Telephone Encounter (Signed)
Phone call to pharmacy, spoke with [facility]. He states the oxybutynin processes through insurance for $10. Patient may have run in to coverage issues due to early request for filling medication. He states he will get medication ready for patient.

## 2018-06-09 DIAGNOSIS — C61 Malignant neoplasm of prostate: Secondary | ICD-10-CM | POA: Diagnosis not present

## 2018-06-16 DIAGNOSIS — C61 Malignant neoplasm of prostate: Secondary | ICD-10-CM | POA: Diagnosis not present

## 2018-08-13 ENCOUNTER — Encounter: Payer: 59 | Admitting: Family Medicine

## 2018-08-25 ENCOUNTER — Other Ambulatory Visit: Payer: Self-pay

## 2018-08-25 ENCOUNTER — Ambulatory Visit (INDEPENDENT_AMBULATORY_CARE_PROVIDER_SITE_OTHER): Payer: 59 | Admitting: Family Medicine

## 2018-08-25 ENCOUNTER — Encounter: Payer: Self-pay | Admitting: Family Medicine

## 2018-08-25 VITALS — BP 118/73 | HR 73 | Temp 98.2°F | Ht 70.0 in | Wt 158.2 lb

## 2018-08-25 DIAGNOSIS — Z0000 Encounter for general adult medical examination without abnormal findings: Secondary | ICD-10-CM

## 2018-08-25 DIAGNOSIS — Z1159 Encounter for screening for other viral diseases: Secondary | ICD-10-CM

## 2018-08-25 DIAGNOSIS — Z13220 Encounter for screening for lipoid disorders: Secondary | ICD-10-CM | POA: Diagnosis not present

## 2018-08-25 DIAGNOSIS — Z114 Encounter for screening for human immunodeficiency virus [HIV]: Secondary | ICD-10-CM

## 2018-08-25 DIAGNOSIS — Z131 Encounter for screening for diabetes mellitus: Secondary | ICD-10-CM

## 2018-08-25 DIAGNOSIS — R61 Generalized hyperhidrosis: Secondary | ICD-10-CM

## 2018-08-25 MED ORDER — OXYBUTYNIN CHLORIDE 5 MG PO TABS: 5.0000 mg | ORAL_TABLET | Freq: Three times a day (TID) | ORAL | 1 refills | Status: DC

## 2018-08-25 NOTE — Patient Instructions (Addendum)
For sweating, I expect that to improve off the Lupron shots. Ok to continue oxybutynin for now, but also recommend clinical strength deodorant (up to 20% aluminum).   If labs elevated, may need to be repeated after fasting 8 hours.   Keeping you healthy  Get these tests  Blood pressure- Have your blood pressure checked once a year by your healthcare provider.  Normal blood pressure is 120/80  Weight- Have your body mass index (BMI) calculated to screen for obesity.  BMI is a measure of body fat based on height and weight. You can also calculate your own BMI at [employer].  Cholesterol- Have your cholesterol checked every year.  Diabetes- Have your blood sugar checked regularly if you have high blood pressure, high cholesterol, have a family history of diabetes or if you are overweight.  Screening for Colon Cancer- Colonoscopy starting at age 16.  Screening may begin sooner depending on your family history and other health conditions. Follow up colonoscopy as directed by your Gastroenterologist.  Screening for Prostate Cancer- Both blood work (PSA) and a rectal exam help screen for Prostate Cancer.  Screening begins at age 66 with African-American men and at age 76 with Caucasian men.  Screening may begin sooner depending on your family history.  Take these medicines  Aspirin- One aspirin daily can help prevent Heart disease and Stroke.  Flu shot- Every fall.  Tetanus- Every 10 years.  Zostavax- Once after the age of 52 to prevent Shingles.  Pneumonia shot- Once after the age of 37; if you are younger than 68, ask your healthcare provider if you need a Pneumonia shot.  Take these steps  Don't smoke- If you do smoke, talk to your doctor about quitting.  For tips on how to quit, go to www.smokefree.gov or call 1-800-QUIT-NOW.  Be physically active- Exercise 5 days a week for at least 30 minutes.  If you are not already physically active start slow and gradually work  up to 30 minutes of moderate physical activity.  Examples of moderate activity include walking briskly, mowing the yard, dancing, swimming, bicycling, etc.  Eat a healthy diet- Eat a variety of healthy food such as fruits, vegetables, low fat milk, low fat cheese, yogurt, lean meant, poultry, fish, beans, tofu, etc. For more information go to www.thenutritionsource.org  Drink alcohol in moderation- Limit alcohol intake to less than two drinks a day. Never drink and drive.  Dentist- Brush and floss twice daily; visit your dentist twice a year.  Depression- Your emotional health is as important as your physical health. If you're feeling down, or losing interest in things you would normally enjoy please talk to your healthcare provider.  Eye exam- Visit your eye doctor every year.  Safe sex- If you may be exposed to a sexually transmitted infection, use a condom.  Seat belts- Seat belts can save your life; always wear one.  Smoke/Carbon Monoxide detectors- These detectors need to be installed on the appropriate level of your home.  Replace batteries at least once a year.  Skin cancer- When out in the sun, cover up and use sunscreen 15 SPF or higher.  Violence- If anyone is threatening you, please tell your healthcare provider.  Living Will/ Health care power of attorney- Speak with your healthcare provider and family.  If you have lab work done today you will be contacted with your lab results within the next 2 weeks.  If you have not heard from [place] then please contact us. The fastest way  to get your results is to register for My Chart.   IF you received an x-ray today, you will receive an invoice from [facility]. Please contact [facility] at [phone] with questions or concerns regarding your invoice.   IF you received labwork today, you will receive an invoice from [place]. Please contact [facility] at [phone] with questions or concerns regarding your invoice.    Our billing staff will not be able to assist you with questions regarding bills from these companies.  You will be contacted with the lab results as soon as they are available. The fastest way to get your results is to activate your My Chart account. Instructions are located on the last page of this paperwork. If you have not heard from [place] regarding the results in 2 weeks, please contact this office.

## 2018-08-25 NOTE — Progress Notes (Signed)
Subjective:  By signing my name below, I, [name], attest that this documentation has been prepared under the direction and in the presence of Cody Ray, MD. Electronically Signed: [name], [place]. 08/25/2018 , 11:51 AM .  Patient was seen in Room 8 .   Patient ID: Cody [name], male    DOB: [birth year], 62 y.o.   MRN: [id number] Chief Complaint  Patient presents with  . Annual Exam    CPE   HPI Cody [name] is a 62 y.o. male Here for annual physical. Last physical in July 2018. He has a history of hyperhidrosis. He is not fasting this morning, last meal was a bowl of cereal at 9:00 AM.   Hyperhidrosis Last discussed in February. He has been treated with oxybutynin 5 mg tid prn previously, but was having more sweating in his armpits in February. He had tried clinical strength deodorant without much relief. He denies any side effects with oxybutynin. The more sweats were thought due to Lupron injections. He was advised to discuss with his urologist regarding Lupron injections. He was recommended to try deodorant with 20% aluminum. He was also advised to discuss with dermatology for Botox or other interventions.   Patient states he hasn't met with dermatologist. He's still using oxybutynin. He mentions deodorant without much relief.   Prostate cancer Prostate cancer with radical prostatectomy (April 2010) with biochemical recurrence noted in 2016. Most recent PSA on Sept 4th, PSA of 0.09 down from 0.11 in February. He's followed by urologist, Dr. Alinda Money. He's treated with Lupron Lupron for androgen deprivation every 4 months. Last office visit with Dr. Alinda Money on Sept 11th. Pros and cons of continued therapy discussed given his side effects; decided to take break from treatment, plan for repeat PSA in 3 months, and repeat office visit in 6 months with Dr. Alinda Money.   Cancer Screening Colonoscopy: May 2016, performed by Dr. Benson Norway.  Prostate cancer: see as above.    Immunizations Immunization History  Administered Date(s) Administered  . Tdap 05/05/2017   Flu vaccine: received by [facility] on Oct 20th.   Depression Depression screen [facility] 2/9 08/25/2018 11/20/2017 05/21/2017 05/05/2017 10/17/2016  Decreased Interest 0 0 0 0 0  Down, Depressed, Hopeless 0 0 0 0 0  PHQ - 2 Score 0 0 0 0 0     Vision  Visual Acuity Screening   Right eye Left eye Both eyes  Without correction:     With correction: 20/30 20/30 20/15-1   Eye doctor: last appointment about over a year ago.   Dentist He isn't followed by dentist.   Exercise He's been exercising regularly, climbing steps, and running after his grandson.    Patient Active Problem List   Diagnosis Date Noted  . Bilateral foot pain 09/24/2016  . 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 Herpes zoster without complication 65/12/5463  . Excessive sweating 12/10/2015  . Common wart 12/10/2015   Past Medical History:  Diagnosis Date  . Cancer [facility])    Prostate   Past Surgical History:  Procedure Laterality Date  . PROSTATE SURGERY    . TONSILLECTOMY     No Known Allergies Prior to Admission medications   Medication Sig Start Date End Date Taking? Authorizing Provider  oxybutynin (DITROPAN) 5 MG tablet Take 1 tablet (5 mg total) by mouth 3 (three) times daily. 11/20/17   Wendie Agreste, MD   Social History   Socioeconomic History  . Marital status: Legally Separated  Spouse name: Not on file  . Number of children: Not on file  . Years of education: Not on file  . Highest education level: Not on file  Occupational History  . Not on file  Social Needs  . Financial resource strain: Not on file  . Food insecurity:    Worry: Not on file    Inability: Not on file  . Transportation needs:    Medical: Not on file    Non-medical: Not on file  Tobacco Use  . Smoking status: Never Smoker  . Smokeless tobacco: Never Used  Substance and Sexual Activity  . Alcohol use: Yes  . Drug  use: No  . Sexual activity: Never  Lifestyle  . Physical activity:    Days per week: Not on file    Minutes per session: Not on file  . Stress: Not on file  Relationships  . Social connections:    Talks on phone: Not on file    Gets together: Not on file    Attends religious service: Not on file    Active member of club or organization: Not on file    Attends meetings of clubs or organizations: Not on file    Relationship status: Not on file  . Intimate partner violence:    Fear of current or ex partner: Not on file    Emotionally abused: Not on file    Physically abused: Not on file    Forced sexual activity: Not on file  Other Topics Concern  . Not on file  Social History Narrative  . Not on file   Review of Systems 13 point ROS - positive for sweating     Objective:   Physical Exam  Constitutional: He is oriented to person, place, and time. He appears well-developed and well-nourished.  HENT:  Head: Normocephalic and atraumatic.  Right Ear: External ear normal.  Left Ear: External ear normal.  Mouth/Throat: Oropharynx is clear and moist.  Eyes: Pupils are equal, round, and reactive to light. Conjunctivae and EOM are normal.  Neck: Normal range of motion. Neck supple. No thyromegaly present.  Cardiovascular: Normal rate, regular rhythm, normal heart sounds and intact distal pulses.  Pulmonary/Chest: Effort normal and breath sounds normal. No respiratory distress. He has no wheezes.  Abdominal: Soft. He exhibits no distension. There is no tenderness.  Musculoskeletal: Normal range of motion. He exhibits no edema or tenderness.  Lymphadenopathy:    He has no cervical adenopathy.  Neurological: He is alert and oriented to person, place, and time. He has normal reflexes.  Skin: Skin is warm and dry.  Psychiatric: He has a normal mood and affect. His behavior is normal.  Vitals reviewed.   Vitals:   08/25/18 1059  BP: 118/73  Pulse: 73  Temp: 98.2 F (36.8 C)   TempSrc: Oral  SpO2: 98%  Weight: 158 lb 3.2 oz (71.8 kg)  Height: '5\' 10"'$  (1.778 m)       Assessment & Plan:   Cody [name] is a 62 y.o. male Annual physical exam  - -anticipatory guidance as below in AVS, screening labs above. Health maintenance items as above in HPI discussed/recommended as applicable.   Screening for hyperlipidemia - Plan: Lipid panel  Screening for diabetes mellitus - Plan: Comprehensive metabolic panel  Encounter for hepatitis C screening test for low risk patient - Plan: Hepatitis C antibody  Screening for HIV (human immunodeficiency virus) - Plan: HIV Antibody (routine testing w rflx)  Excessive sweating - Plan:  oxybutynin (DITROPAN) 5 MG tablet  - expect improvement off lupron injections. Continue ditropan and high strenght deodorant recommended with RTC precautions.   Meds ordered this encounter  Medications  . oxybutynin (DITROPAN) 5 MG tablet    Sig: Take 1 tablet (5 mg total) by mouth 3 (three) times daily.    Dispense:  270 tablet    Refill:  1   Patient Instructions    For sweating, I expect that to improve off the Lupron shots. Ok to continue oxybutynin for now, but also recommend clinical strength deodorant (up to 20% aluminum).   If labs elevated, may need to be repeated after fasting 8 hours.   Keeping you healthy  Get these tests  Blood pressure- Have your blood pressure checked once a year by your healthcare provider.  Normal blood pressure is 120/80  Weight- Have your body mass index (BMI) calculated to screen for obesity.  BMI is a measure of body fat based on height and weight. You can also calculate your own BMI at [employer].si.  Cholesterol- Have your cholesterol checked every year.  Diabetes- Have your blood sugar checked regularly if you have high blood pressure, high cholesterol, have a family history of diabetes or if you are overweight.  Screening for Colon Cancer- Colonoscopy starting at age 76.   Screening may begin sooner depending on your family history and other health conditions. Follow up colonoscopy as directed by your Gastroenterologist.  Screening for Prostate Cancer- Both blood work (PSA) and a rectal exam help screen for Prostate Cancer.  Screening begins at age 65 with African-American men and at age 78 with Caucasian men.  Screening may begin sooner depending on your family history.  Take these medicines  Aspirin- One aspirin daily can help prevent Heart disease and Stroke.  Flu shot- Every fall.  Tetanus- Every 10 years.  Zostavax- Once after the age of 79 to prevent Shingles.  Pneumonia shot- Once after the age of 62; if you are younger than 76, ask your healthcare provider if you need a Pneumonia shot.  Take these steps  Don't smoke- If you do smoke, talk to your doctor about quitting.  For tips on how to quit, go to www.smokefree.gov or call 1-800-QUIT-NOW.  Be physically active- Exercise 5 days a week for at least 30 minutes.  If you are not already physically active start slow and gradually work up to 30 minutes of moderate physical activity.  Examples of moderate activity include walking briskly, mowing the yard, dancing, swimming, bicycling, etc.  Eat a healthy diet- Eat a variety of healthy food such as fruits, vegetables, low fat milk, low fat cheese, yogurt, lean meant, poultry, fish, beans, tofu, etc. For more information go to www.thenutritionsource.org  Drink alcohol in moderation- Limit alcohol intake to less than two drinks a day. Never drink and drive.  Dentist- Brush and floss twice daily; visit your dentist twice a year.  Depression- Your emotional health is as important as your physical health. If you're feeling down, or losing interest in things you would normally enjoy please talk to your healthcare provider.  Eye exam- Visit your eye doctor every year.  Safe sex- If you may be exposed to a sexually transmitted infection, use a condom.  Seat  belts- Seat belts can save your life; always wear one.  Smoke/Carbon Monoxide detectors- These detectors need to be installed on the appropriate level of your home.  Replace batteries at least once a year.  Skin cancer- When out in  the sun, cover up and use sunscreen 15 SPF or higher.  Violence- If anyone is threatening you, please tell your healthcare provider.  Living Will/ Health care power of attorney- Speak with your healthcare provider and family.  If you have lab work done today you will be contacted with your lab results within the next 2 weeks.  If you have not heard from [place] then please contact us. The fastest way to get your results is to register for My Chart.   IF you received an x-[name] today, you will receive an invoice from [facility]. Please contact [facility] at [phone] with questions or concerns regarding your invoice.   IF you received labwork today, you will receive an invoice from [place]. Please contact [facility] at [phone] with questions or concerns regarding your invoice.   Our billing staff will not be able to assist you with questions regarding bills from these companies.  You will be contacted with the lab results as soon as they are available. The fastest way to get your results is to activate your My Chart account. Instructions are located on the last page of this paperwork. If you have not heard from [place] regarding the results in 2 weeks, please contact this office.       I personally performed the services described in this documentation, which was scribed in my presence. The recorded information has been reviewed and considered for accuracy and completeness, addended by me as needed, and agree with information above.  Signed,   Cody Ray, MD [facility].  08/25/18 1:36 PM

## 2018-08-26 LAB — COMPREHENSIVE METABOLIC PANEL
A/G RATIO: 1.7 (ref 1.2–2.2)
ALBUMIN: 4.2 g/dL (ref 3.6–4.8)
ALK PHOS: 112 IU/L (ref 39–117)
ALT: 16 IU/L (ref 0–44)
AST: 21 IU/L (ref 0–40)
BUN / CREAT RATIO: 20 (ref 10–24)
BUN: 19 mg/dL (ref 8–27)
Bilirubin Total: <0.2 mg/dL (ref 0.0–1.2)
CO2: 22 mmol/L (ref 20–29)
CREATININE: 0.95 mg/dL (ref 0.76–1.27)
Calcium: 9.5 mg/dL (ref 8.6–10.2)
Chloride: 103 mmol/L (ref 96–106)
GFR calc Af Amer: 97 mL/min/{1.73_m2} (ref >59)
GFR, EST NON AFRICAN AMERICAN: 84 mL/min/{1.73_m2} (ref >59)
GLOBULIN, TOTAL: 2.5 g/dL (ref 1.5–4.5)
Glucose: 90 mg/dL (ref 65–99)
POTASSIUM: 4.4 mmol/L (ref 3.5–5.2)
SODIUM: 141 mmol/L (ref 134–144)
Total Protein: 6.7 g/dL (ref 6.0–8.5)

## 2018-08-26 LAB — HIV ANTIBODY (ROUTINE TESTING W REFLEX): HIV Screen 4th Generation wRfx: NONREACTIVE

## 2018-08-26 LAB — LIPID PANEL
CHOLESTEROL TOTAL: 190 mg/dL (ref 100–199)
Chol/HDL Ratio: 5.1 ratio — ABNORMAL HIGH (ref 0.0–5.0)
HDL: 37 mg/dL — AB (ref >39)
LDL Calculated: 116 mg/dL — ABNORMAL HIGH (ref 0–99)
TRIGLYCERIDES: 183 mg/dL — AB (ref 0–149)
VLDL Cholesterol Cal: 37 mg/dL (ref 5–40)

## 2018-08-26 LAB — HEPATITIS C ANTIBODY

## 2018-09-17 DIAGNOSIS — C61 Malignant neoplasm of prostate: Secondary | ICD-10-CM | POA: Diagnosis not present

## 2018-12-14 ENCOUNTER — Telehealth: Payer: Self-pay | Admitting: Family Medicine

## 2018-12-14 ENCOUNTER — Other Ambulatory Visit: Payer: Self-pay | Admitting: Family Medicine

## 2018-12-14 DIAGNOSIS — R61 Generalized hyperhidrosis: Secondary | ICD-10-CM

## 2018-12-14 NOTE — Telephone Encounter (Signed)
Attempted to call patient to let him know that he should have a refill at his pharmacy. No answer, left message for him to check with his pharmacy.

## 2018-12-15 DIAGNOSIS — C61 Malignant neoplasm of prostate: Secondary | ICD-10-CM | POA: Diagnosis not present

## 2018-12-22 DIAGNOSIS — C61 Malignant neoplasm of prostate: Secondary | ICD-10-CM | POA: Diagnosis not present

## 2018-12-22 DIAGNOSIS — R232 Flushing: Secondary | ICD-10-CM | POA: Diagnosis not present

## 2018-12-27 DIAGNOSIS — Z5111 Encounter for antineoplastic chemotherapy: Secondary | ICD-10-CM | POA: Diagnosis not present

## 2018-12-27 DIAGNOSIS — C61 Malignant neoplasm of prostate: Secondary | ICD-10-CM | POA: Diagnosis not present

## 2019-03-03 ENCOUNTER — Ambulatory Visit: Payer: 59 | Admitting: Family Medicine

## 2019-03-04 ENCOUNTER — Encounter: Payer: Self-pay | Admitting: Family Medicine

## 2019-03-04 ENCOUNTER — Other Ambulatory Visit: Payer: Self-pay

## 2019-03-04 ENCOUNTER — Ambulatory Visit: Payer: 59 | Admitting: Family Medicine

## 2019-03-04 VITALS — BP 138/79 | HR 82 | Temp 98.8°F | Resp 18 | Ht 70.0 in | Wt 149.8 lb

## 2019-03-04 DIAGNOSIS — R61 Generalized hyperhidrosis: Secondary | ICD-10-CM

## 2019-03-04 DIAGNOSIS — E782 Mixed hyperlipidemia: Secondary | ICD-10-CM | POA: Diagnosis not present

## 2019-03-04 NOTE — Patient Instructions (Addendum)
  Depending on cholesterol readings can discuss options for treatment.  Keep up the good work with exercise.  If any worsening sweating, return to discuss other treatment options but Lupron still may be the main culprit.   If you have lab work done today you will be contacted with your lab results within the next 2 weeks.  If you have not heard from [place] then please contact us. The fastest way to get your results is to register for My Chart.   IF you received an x-ray today, you will receive an invoice from [facility]. Please contact [facility] at [phone] with questions or concerns regarding your invoice.   IF you received labwork today, you will receive an invoice from [place]. Please contact [facility] at [phone] with questions or concerns regarding your invoice.   Our billing staff will not be able to assist you with questions regarding bills from these companies.  You will be contacted with the lab results as soon as they are available. The fastest way to get your results is to activate your My Chart account. Instructions are located on the last page of this paperwork. If you have not heard from [place] regarding the results in 2 weeks, please contact this office.

## 2019-03-04 NOTE — Progress Notes (Signed)
Subjective:    Patient ID: Cody [name], male    DOB: Feb 06, 1956, 63 y.o.   MRN: [id number]  HPI Cody [name] is a 63 y.o. male Presents today for: Chief Complaint  Patient presents with  . Excessive Sweating    pt states he found something that is helping bot sure why he is here   Hyperhidrosis: Thought to be due to Lupron injections used for androgen deprivation with prostate cancer.  Ultimately was controlled with use of oxybutynin.  Prescription strength deodorant was less effective.  When last discussed in November he was taking a break from the Lupron injections and expected the hyperhidrosis also improved. Stopped taking oxybutynin - didn't feel like it worked.  Stopped about a month ago.  "sweat block" online - topical treatment every 7 days - axilla only - has been working well.  lupron restarted after March urology visit- 1 injection, but sweating ok.  Rare sweating at night with Lupron.   Hyperlipidemia:  Lab Results  Component Value Date   CHOL 190 08/25/2018   HDL 37 (L) 08/25/2018   LDLCALC 116 (H) 08/25/2018   TRIG 183 (H) 08/25/2018   CHOLHDL 5.1 (H) 08/25/2018   Lab Results  Component Value Date   ALT 16 08/25/2018   AST 21 08/25/2018   ALKPHOS 112 08/25/2018   BILITOT <0.2 08/25/2018   ASCVD risk 8.9% in November. Planned diet approach initially, with rechck in 6 months. Exercising more.      Patient Active Problem List   Diagnosis Date Noted  . Bilateral foot pain 09/24/2016  . 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 Herpes zoster without complication 63/84/6659  . Excessive sweating 12/10/2015  . Common wart 12/10/2015   Past Medical History:  Diagnosis Date  . Cancer [facility])    Prostate   Past Surgical History:  Procedure Laterality Date  . PROSTATE SURGERY    . TONSILLECTOMY     No Known Allergies Prior to Admission medications   Medication Sig Start Date End Date Taking? Authorizing Provider  oxybutynin (DITROPAN) 5 MG tablet Take 1  tablet (5 mg total) by mouth 3 (three) times daily. Patient not taking: Reported on 03/04/2019 08/25/18   Wendie Agreste, MD   Social History   Socioeconomic History  . Marital status: Legally Separated    Spouse name: Not on file  . Number of children: Not on file  . Years of education: Not on file  . Highest education level: Not on file  Occupational History  . Not on file  Social Needs  . Financial resource strain: Not on file  . Food insecurity:    Worry: Not on file    Inability: Not on file  . Transportation needs:    Medical: Not on file    Non-medical: Not on file  Tobacco Use  . Smoking status: Never Smoker  . Smokeless tobacco: Never Used  Substance and Sexual Activity  . Alcohol use: Yes  . Drug use: No  . Sexual activity: Never  Lifestyle  . Physical activity:    Days per week: Not on file    Minutes per session: Not on file  . Stress: Not on file  Relationships  . Social connections:    Talks on phone: Not on file    Gets together: Not on file    Attends religious service: Not on file    Active member of club or organization: Not on file    Attends meetings of clubs or  organizations: Not on file    Relationship status: Not on file  . Intimate partner violence:    Fear of current or ex partner: Not on file    Emotionally abused: Not on file    Physically abused: Not on file    Forced sexual activity: Not on file  Other Topics Concern  . Not on file  Social History Narrative  . Not on file    Review of Systems  Constitutional: Positive for diaphoresis. Negative for chills, fatigue, fever and unexpected weight change.  Eyes: Negative for visual disturbance.  Respiratory: Negative for cough, chest tightness and shortness of breath.   Cardiovascular: Negative for chest pain, palpitations and leg swelling.  Gastrointestinal: Negative for abdominal pain and blood in stool.  Neurological: Negative for dizziness, light-headedness and headaches.        Objective:   Physical Exam Vitals signs reviewed.  Constitutional:      Appearance: He is well-developed.  HENT:     Head: Normocephalic and atraumatic.  Eyes:     Pupils: Pupils are equal, round, and reactive to light.  Neck:     Vascular: No carotid bruit or JVD.  Cardiovascular:     Rate and Rhythm: Normal rate and regular rhythm.     Heart sounds: Normal heart sounds. No murmur.  Pulmonary:     Effort: Pulmonary effort is normal.     Breath sounds: Normal breath sounds. No rales.  Skin:    General: Skin is warm and dry.  Neurological:     Mental Status: He is alert and oriented to person, place, and time.    Vitals:   03/04/19 0830  BP: 138/79  Pulse: 82  Resp: 18  Temp: 98.8 F (37.1 C)  TempSrc: Oral  SpO2: 99%  Weight: 149 lb 12.8 oz (67.9 kg)  Height: 5\' 10"  (1.778 m)      Assessment & Plan:  Cody [name] is a 63 y.o. male Excessive sweating  -Improved.  Using over-the-counter treatment.  Discussed that I cannot recommend for against that medication without further knowledge of the ingredients.  Understanding expressed.  Option of additional medications if worsening symptoms now that Lupron injections have restarted.  Hyperlipidemia, mixed - Plan: Lipid panel, Comprehensive metabolic panel  - Borderline ASCVD risk previously.  Recheck levels.  Continue exercise.    No orders of the defined types were placed in this encounter.  Patient Instructions    Depending on cholesterol readings can discuss options for treatment.  Keep up the good work with exercise.  If any worsening sweating, return to discuss other treatment options but Lupron still may be the main culprit.   If you have lab work done today you will be contacted with your lab results within the next 2 weeks.  If you have not heard from [place] then please contact us. The fastest way to get your results is to register for My Chart.   IF you received an x-ray today, you will receive an invoice from  [facility]. Please contact [facility] Radiology at [phone] with questions or concerns regarding your invoice.   IF you received labwork today, you will receive an invoice from [place]. Please contact [facility] at [phone] with questions or concerns regarding your invoice.   Our billing staff will not be able to assist you with questions regarding bills from these companies.  You will be contacted with the lab results as soon as they are available. The fastest way to get your results  is to activate your My Chart account. Instructions are located on the last page of this paperwork. If you have not heard from [place] regarding the results in 2 weeks, please contact this office.

## 2019-03-05 LAB — COMPREHENSIVE METABOLIC PANEL
ALT: 23 IU/L (ref 0–44)
AST: 29 IU/L (ref 0–40)
Albumin/Globulin Ratio: 2 (ref 1.2–2.2)
Albumin: 4.5 g/dL (ref 3.8–4.8)
Alkaline Phosphatase: 103 IU/L (ref 39–117)
BUN/Creatinine Ratio: 23 (ref 10–24)
BUN: 21 mg/dL (ref 8–27)
Bilirubin Total: <0.2 mg/dL (ref 0.0–1.2)
CO2: 24 mmol/L (ref 20–29)
Calcium: 9.5 mg/dL (ref 8.6–10.2)
Chloride: 101 mmol/L (ref 96–106)
Creatinine, Ser: 0.93 mg/dL (ref 0.76–1.27)
GFR calc Af Amer: 99 mL/min/{1.73_m2} (ref >59)
GFR calc non Af Amer: 86 mL/min/{1.73_m2} (ref >59)
Globulin, Total: 2.3 g/dL (ref 1.5–4.5)
Glucose: 107 mg/dL — ABNORMAL HIGH (ref 65–99)
Potassium: 4.3 mmol/L (ref 3.5–5.2)
Sodium: 139 mmol/L (ref 134–144)
Total Protein: 6.8 g/dL (ref 6.0–8.5)

## 2019-03-05 LAB — LIPID PANEL
Chol/HDL Ratio: 5 ratio (ref 0.0–5.0)
Cholesterol, Total: 211 mg/dL — ABNORMAL HIGH (ref 100–199)
HDL: 42 mg/dL (ref >39)
LDL Calculated: 144 mg/dL — ABNORMAL HIGH (ref 0–99)
Triglycerides: 124 mg/dL (ref 0–149)
VLDL Cholesterol Cal: 25 mg/dL (ref 5–40)

## 2020-09-27 ENCOUNTER — Other Ambulatory Visit: Payer: Self-pay | Admitting: Urology

## 2020-09-27 DIAGNOSIS — C61 Malignant neoplasm of prostate: Secondary | ICD-10-CM

## 2020-09-27 DIAGNOSIS — M8580 Other specified disorders of bone density and structure, unspecified site: Secondary | ICD-10-CM

## 2021-01-11 ENCOUNTER — Other Ambulatory Visit: Payer: 59

## 2021-01-24 ENCOUNTER — Other Ambulatory Visit: Payer: Self-pay

## 2021-01-24 ENCOUNTER — Ambulatory Visit
Admission: RE | Admit: 2021-01-24 | Discharge: 2021-01-24 | Disposition: A | Discharge status: Home / Self Care | Payer: 59 | Source: Ambulatory Visit | Attending: Urology | Admitting: Urology

## 2021-01-24 DIAGNOSIS — C61 Malignant neoplasm of prostate: Secondary | ICD-10-CM

## 2021-01-24 DIAGNOSIS — M8580 Other specified disorders of bone density and structure, unspecified site: Secondary | ICD-10-CM

## 2021-11-25 ENCOUNTER — Other Ambulatory Visit (HOSPITAL_COMMUNITY): Payer: Self-pay | Admitting: Nurse Practitioner

## 2021-11-25 DIAGNOSIS — C61 Malignant neoplasm of prostate: Secondary | ICD-10-CM

## 2021-12-02 ENCOUNTER — Encounter (HOSPITAL_COMMUNITY)
Admission: RE | Admit: 2021-12-02 | Discharge: 2021-12-02 | Disposition: A | Discharge status: Home / Self Care | Payer: 59 | Source: Ambulatory Visit | Attending: Nurse Practitioner | Admitting: Nurse Practitioner

## 2021-12-02 ENCOUNTER — Other Ambulatory Visit: Payer: Self-pay

## 2021-12-02 DIAGNOSIS — C61 Malignant neoplasm of prostate: Secondary | ICD-10-CM | POA: Diagnosis not present

## 2021-12-02 MED ORDER — PIFLIFOLASTAT F 18 (PYLARIFY) INJECTION
9.0000 | Freq: Once | INTRAVENOUS | Status: AC
  Administered 2021-12-02: 9.5 via INTRAVENOUS

## 2021-12-10 ENCOUNTER — Ambulatory Visit (HOSPITAL_COMMUNITY)
Admission: EM | Admit: 2021-12-10 | Discharge: 2021-12-10 | Disposition: A | Discharge status: Home / Self Care | Payer: 59 | Attending: Student | Admitting: Student

## 2021-12-10 ENCOUNTER — Other Ambulatory Visit (HOSPITAL_COMMUNITY): Payer: 59

## 2021-12-10 ENCOUNTER — Encounter (HOSPITAL_COMMUNITY): Payer: Self-pay | Admitting: Emergency Medicine

## 2021-12-10 ENCOUNTER — Other Ambulatory Visit: Payer: Self-pay

## 2021-12-10 DIAGNOSIS — M7061 Trochanteric bursitis, right hip: Secondary | ICD-10-CM

## 2021-12-10 MED ORDER — METHYLPREDNISOLONE SODIUM SUCC 125 MG IJ SOLR
INTRAMUSCULAR | Status: AC
  Filled 2021-12-10: qty 2

## 2021-12-10 MED ORDER — MELOXICAM 7.5 MG PO TABS: 7.5000 mg | ORAL_TABLET | Freq: Every day | ORAL | 1 refills | Status: DC

## 2021-12-10 MED ORDER — METHYLPREDNISOLONE SODIUM SUCC 125 MG IJ SOLR
80.0000 mg | Freq: Once | INTRAMUSCULAR | Status: AC
  Administered 2021-12-10: 80 mg via INTRAMUSCULAR

## 2021-12-10 NOTE — Discharge Instructions (Addendum)
-  Meloxicam once daily with meals. Can increase to every 12 hours if needed. Avoid other NSAIDs while taking this including ibuprofen.  ?-Rest, ice, heat ?-Follow-up with PCP if symptoms persist. They would probably refer you to orthopedics.  ?

## 2021-12-10 NOTE — ED Provider Notes (Signed)
?Nathalie ? ? ? ?CSN: [id number] ?Arrival date & time: 12/10/21  0810 ? ? ?  ? ?History   ?Chief Complaint ?Chief Complaint  ?Patient presents with  ? Leg Pain  ? ? ?HPI ?Cody [name] is a 66 y.o. male presenting with R leg pain. History osteopenia.  Describes 5 days of right groin pain radiating down the inner groin and thigh.  States minimal pain at rest, but he works long overnight shifts walking and standing all night on concrete.  Pain gets progressively worse throughout the night, improves when he is able to rest, and then gets worse again.  Denies sensation changes.  Denies hip pain.  Denies falls, trauma.  Has attempted various interventions including heating pad with temporary relief.  Does not have a PCP. ? ?HPI ? ?Past Medical History:  ?Diagnosis Date  ? Cancer [facility])   ? Prostate  ? ? ?Patient Active Problem List  ? Diagnosis Date Noted  ? Bilateral foot pain 09/24/2016  ? 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 Herpes zoster without complication 23/76/2831  ? Excessive sweating 12/10/2015  ? Common wart 12/10/2015  ? ? ?Past Surgical History:  ?Procedure Laterality Date  ? PROSTATE SURGERY    ? TONSILLECTOMY    ? ? ? ? ? ?Home Medications   ? ?Prior to Admission medications   ?Medication Sig Start Date End Date Taking? Authorizing Provider  ?meloxicam (MOBIC) 7.5 MG tablet Take 1 tablet (7.5 mg total) by mouth daily. 12/10/21  Yes Hazel Sams, PA-C  ? ? ?Family History ?History reviewed. No pertinent family history. ? ?Social History ?Social History  ? ?Tobacco Use  ? Smoking status: Never  ? Smokeless tobacco: Never  ?Vaping Use  ? Vaping Use: Never used  ?Substance Use Topics  ? Alcohol use: Yes  ? Drug use: Yes  ?  Types: Marijuana  ? ? ? ?Allergies   ?Patient has no known allergies. ? ? ?Review of Systems ?Review of Systems  ?Musculoskeletal:   ?     R groin pain   ?All other systems reviewed and are negative. ? ? ?Physical Exam ?Triage Vital Signs ?ED Triage Vitals  ?Enc Vitals Group  ?    BP 12/10/21 0842 (!) 130/58  ?   Pulse Rate 12/10/21 0842 71  ?   Resp 12/10/21 0842 20  ?   Temp 12/10/21 0842 98.7 ?F (37.1 ?C)  ?   Temp Source 12/10/21 0842 Oral  ?   SpO2 12/10/21 0842 99 %  ?   Weight --   ?   Height --   ?   Head Circumference --   ?   Peak Flow --   ?   Pain Score 12/10/21 0839 4  ?   Pain Loc --   ?   Pain Edu? --   ?   Excl. in [place]? --   ? ?No data found. ? ?Updated Vital Signs ?BP (!) 130/58 (BP Location: Left Arm)   Pulse 71   Temp 98.7 ?F (37.1 ?C) (Oral)   Resp 20   SpO2 99%  ? ?Visual Acuity ?Right Eye Distance:   ?Left Eye Distance:   ?Bilateral Distance:   ? ?Right Eye Near:   ?Left Eye Near:    ?Bilateral Near:    ? ?Physical Exam ?Vitals reviewed.  ?Constitutional:   ?   General: He is not in acute distress. ?   Appearance: Normal appearance. He is not ill-appearing.  ?HENT:  ?  Head: Normocephalic and atraumatic.  ?Pulmonary:  ?   Effort: Pulmonary effort is normal.  ?Musculoskeletal:  ?   Comments: R hip and groin - no skin changes or swelling. No reproducible tenderness to palpation. No pain with flexion and extension. No pain with internal and external rotation. No thigh pain to palpation. No knee swelling or skin changes. ROM intact and without pain. No calf tenderness. Gait intact but with mild pain.  ?Neurological:  ?   General: No focal deficit present.  ?   Mental Status: He is alert and oriented to person, place, and time.  ?Psychiatric:     ?   Mood and Affect: Mood normal.     ?   Behavior: Behavior normal.     ?   Thought Content: Thought content normal.     ?   Judgment: Judgment normal.  ? ? ? ?UC Treatments / Results  ?Labs ?(all labs ordered are listed, but only abnormal results are displayed) ?Labs Reviewed - No data to display ? ?EKG ? ? ?Radiology ?No results found. ? ?Procedures ?Procedures (including critical care time) ? ?Medications Ordered in UC ?Medications  ?methylPREDNISolone sodium succinate (SOLU-MEDROL) 125 mg/2 mL injection 80 mg (80 mg  Intramuscular Given 12/10/21 0938)  ? ? ?Initial Impression / Assessment and Plan / UC Course  ?I have reviewed the triage vital signs and the nursing notes. ? ?Pertinent labs & imaging results that were available during my care of the patient were reviewed by me and considered in my medical decision making (see chart for details). ? ?  ? ?This patient is a very pleasant 66 y.o. year old male presenting with R groin pain x5 days following overuse. Neurovascularly intact. No trauma. Pt's job involves prolonged standing and walking on hard concrete floor. Reassuring exam. Ddx is likely OA with trochanteric bursitis. Trial of IM solumedrol and meloxicam. RICE. He declines work note. Would consider imaging/ referral to ortho if symptoms persist. ED return precautions discussed. Patient verbalizes understanding and agreement.  ?  ? ?Final Clinical Impressions(s) / UC Diagnoses  ? ?Final diagnoses:  ?Trochanteric bursitis of right hip  ? ? ? ?Discharge Instructions   ? ?  ?-Meloxicam once daily with meals. Can increase to every 12 hours if needed. Avoid other NSAIDs while taking this including ibuprofen.  ?-Rest, ice, heat ?-Follow-up with PCP if symptoms persist. They would probably refer you to orthopedics.  ? ? ? ? ?ED Prescriptions   ? ? Medication Sig Dispense Auth. Provider  ? meloxicam (MOBIC) 7.5 MG tablet Take 1 tablet (7.5 mg total) by mouth daily. 21 tablet Hazel Sams, PA-C  ? ?  ? ?PDMP not reviewed this encounter. ?  ?Hazel Sams, PA-C ?12/10/21 7829 ? ?

## 2021-12-10 NOTE — ED Triage Notes (Signed)
Right leg pain from groin to knee.  Reports the more he walks, the worst it gets.  Onset on 12/04/2021.  Denies any injury.   ?

## 2021-12-16 ENCOUNTER — Telehealth: Payer: Self-pay | Admitting: Oncology

## 2021-12-16 NOTE — Telephone Encounter (Signed)
Scheduled appt per 3/13 referral. Pt is aware of appt date and time. Pt is aware to arrive 15 mins prior to appt time and to bring and updated insurance card. Pt is aware of appt location.   ?

## 2022-01-03 ENCOUNTER — Telehealth: Payer: Self-pay

## 2022-01-03 ENCOUNTER — Other Ambulatory Visit: Payer: Self-pay

## 2022-01-03 ENCOUNTER — Other Ambulatory Visit (HOSPITAL_COMMUNITY): Payer: Self-pay

## 2022-01-03 ENCOUNTER — Inpatient Hospital Stay: Payer: 59 | Attending: Oncology | Admitting: Oncology

## 2022-01-03 VITALS — BP 126/70 | HR 61 | Temp 97.8°F | Resp 17 | Ht 70.0 in | Wt 147.1 lb

## 2022-01-03 DIAGNOSIS — C61 Malignant neoplasm of prostate: Secondary | ICD-10-CM | POA: Diagnosis not present

## 2022-01-03 MED ORDER — ABIRATERONE ACETATE 250 MG PO TABS: 1000.0000 mg | ORAL_TABLET | Freq: Every day | ORAL | 0 refills | Status: DC

## 2022-01-03 MED ORDER — ABIRATERONE ACETATE 250 MG PO TABS
1000.0000 mg | ORAL_TABLET | Freq: Every day | ORAL | 0 refills | Status: DC
Start: 2022-01-03 — End: 2022-01-03
  Filled 2022-01-03: qty 120, 30d supply, fill #0

## 2022-01-03 MED ORDER — PREDNISONE 5 MG PO TABS
5.0000 mg | ORAL_TABLET | Freq: Every day | ORAL | 3 refills | Status: DC
  Filled 2022-01-03: qty 90, 90d supply, fill #0

## 2022-01-03 NOTE — Telephone Encounter (Signed)
Oral Oncology Pharmacist Encounter ? ?Received new prescription for abiraterone (Zytiga) for the treatment of prostate cancer in conjunction with prednisone, planned duration until disease progression or unacceptable toxicity. Prescription dose and frequency assessed. ? ?Labs from 3/32/23 are standing orders and unable to assess at this time. ? ?Current medication list in Epic reviewed, DDIs with zytiga identified: none ? ?Evaluated chart and no patient barriers to medication adherence noted.  ? ?Patient agreement for treatment documented in MD note on 01/03/2022. ? ?Prescription has been e-scribed to the [facility] for benefits analysis and approval. ? ?Oral Oncology Clinic will continue to follow for insurance authorization, copayment issues, initial counseling and start date. ? ?Drema Halon, PharmD ?Hematology/Oncology Clinical Pharmacist ?[facility] ?[phone] ?01/03/2022 11:36 AM ? ?

## 2022-01-03 NOTE — Telephone Encounter (Signed)
Oral Oncology Patient Advocate Encounter ? ?Prior Authorization for Cody [name] has been approved.   ? ?PA# [id number] ?Effective dates: 01/03/22 through 01/04/23 ? ?Patient must use [employer] ? ?Oral Oncology Clinic will continue to follow.  ? ?Wynn Maudlin CPHT ?Specialty Pharmacy Patient Advocate ?[place] ?Phone [phone] ?Fax [phone] ?01/03/2022 1:36 PM ? ?

## 2022-01-03 NOTE — Progress Notes (Signed)
Introduced myself to the patient as the prostate nurse navigator, and explained my role.  He is here to discuss his treatment options with Dr. Alen Blew.  Patient is currently under the care of Dr. Alinda Money receiving ADT.  I gave him my business card and asked him to call me with questions or concerns.  Verbalized understanding.  ?

## 2022-01-03 NOTE — Telephone Encounter (Signed)
Oral Oncology Patient Advocate Encounter ?  ?Received notification from [employer] that prior authorization for Cody [name] is required. ?  ?PA submitted on CoverMyMeds ?Key [id number] ?Status is pending ?  ?Oral Oncology Clinic will continue to follow. ? ?Wynn Maudlin CPHT ?Specialty Pharmacy Patient Advocate ?[place] ?Phone [phone] ?Fax [phone] ?01/03/2022 11:56 AM ? ? ? ?

## 2022-01-03 NOTE — Progress Notes (Signed)
?Reason for the request:    Prostate cancer ? ?HPI: I was asked by Dr. Alinda Money to evaluate Cody [name] for the evaluation of prostate cancer.  He is a 66 year old man with prostate cancer diagnosed in 2010.  At that time he was found to have a PSA of 10.1 and underwent a radical prostatectomy on April 26 of the year.  He final pathological staging showed a Gleason score of 3+4 =7.  His PSA started to rise in 2012 but failed to reestablish care until 2016 with a PSA up to 21.82.  He was started on androgen deprivation therapy since that time and has been receiving it intermittently.  It was restarted in 2018 and stopped in 2019 and subsequently restarted in March 2020.  His PSA nadired to 0.15 in June 2020 but started to rise since that time.  His PSA on November 13, 2021 was 3.22 which is a rise from 1.0 in May 2022 and 2.15 from November 2022.  His testosterone level showed near castrate level at that time. ? ?Staging scans including a PSMA PET scan in February 2028 showed peritoneal involvement predominantly in the pelvis.  Uptake within the liver region was concerning for hepatic metastasis and further imaging was recommended.  CT scan of the abdomen obtained on December 24, 2020 showed multiple hypodensities in both hepatic lobes which showed predominantly benign cysts without any evidence of malignancy.  Upper normal lymph nodes in the hepatoduodenal ligament. ? ?Clinically, he reports feeling well without any major complaints.  He denies any nausea, vomiting or abdominal pain.  He denies any weight loss or appetite changes he continues to have excellent performance status. ? ? ? ? He does not report any headaches, blurry vision, syncope or seizures. Does not report any fevers, chills or sweats.  Does not report any cough, wheezing or hemoptysis.  Does not report any chest pain, palpitation, orthopnea or leg edema.  Does not report any nausea, vomiting or abdominal pain.  Does not report any constipation or diarrhea.   Does not report any skeletal complaints.    Does not report frequency, urgency or hematuria.  Does not report any skin rashes or lesions. Does not report any heat or cold intolerance.  Does not report any lymphadenopathy or petechiae.  Does not report any anxiety or depression.  Remaining review of systems is negative.  ? ? ? ?Past Medical History:  ?Diagnosis Date  ? Cancer [facility])   ? Prostate  ?: ? ? ?Past Surgical History:  ?Procedure Laterality Date  ? PROSTATE SURGERY    ? TONSILLECTOMY    ?: ? ? ?Current Outpatient Medications:  ?  meloxicam (MOBIC) 7.5 MG tablet, Take 1 tablet (7.5 mg total) by mouth daily., Disp: 21 tablet, Rfl: 1: ? ?No Known Allergies: ? ?No family history on file.: ? ? ?Social History  ? ?Socioeconomic History  ? Marital status: Legally Separated  ?  Spouse name: Not on file  ? Number of children: Not on file  ? Years of education: Not on file  ? Highest education level: Not on file  ?Occupational History  ? Not on file  ?Tobacco Use  ? Smoking status: Never  ? Smokeless tobacco: Never  ?Vaping Use  ? Vaping Use: Never used  ?Substance and Sexual Activity  ? Alcohol use: Yes  ? Drug use: Yes  ?  Types: Marijuana  ? Sexual activity: Never  ?Other Topics Concern  ? Not on file  ?Social History Narrative  ?  Not on file  ? ?Social Determinants of Health  ? ?Financial Resource Strain: Not on file  ?Food Insecurity: Not on file  ?Transportation Needs: Not on file  ?Physical Activity: Not on file  ?Stress: Not on file  ?Social Connections: Not on file  ?Intimate Partner Violence: Not on file  ?: ? ?Pertinent items are noted in HPI. ? ?Exam: ? ?General appearance: alert and cooperative appeared without distress. ?Head: atraumatic without any abnormalities. ?Eyes: conjunctivae/corneas clear. PERRL.  Sclera anicteric. ?Throat: lips, mucosa, and tongue normal; without oral thrush or ulcers. ?Resp: clear to auscultation bilaterally without rhonchi, wheezes or dullness to percussion. ?Cardio: regular  rate and rhythm, S1, S2 normal, no murmur, click, rub or gallop ?GI: soft, non-tender; bowel sounds normal; no masses,  no organomegaly ?Skin: Skin color, texture, turgor normal. No rashes or lesions ?Lymph nodes: Cervical, supraclavicular, and axillary nodes normal. ?Neurologic: Grossly normal without any motor, sensory or deep tendon reflexes. ?Musculoskeletal: No joint deformity or effusion. ? ? ?Assessment and Plan:  ? ?66 year old with: ? ?1.  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  Imaging studies showed peritoneal involvement without any bone disease, lymphadenopathy or visceral metastasis.  He is currently on Eligard every 6 months under the care of Dr. Alinda Money. ? ?The natural course of this disease was reviewed at this time and treatment choices were reiterated.  Additional therapy including androgen receptor pathway inhibitors, androgen receptor antagonists, systemic chemotherapy as well as PARP inhibitor vision as well as radiopharmaceutical options were discussed.  Given his limited disease and slowly progressing nature of his PSA I recommended oral targeted therapy will be reasonable at this time.  I would like to update his PSA, laboratory data as well as [employer] analysis to evaluate for possible candidacy of a PARP inhibitor soon. ? ?Zytiga 1000 mg daily with prednisone were discussed today as a potential option for treatment.  These complications with nausea, fatigue, hypertension and adrenal insufficiency.  He is agreeable to proceed with this treatment. ? ?2.  Androgen deprivation therapy: I recommended continuing this indefinitely.  He is  receiving this treatment under the care of Dr. Alinda Money. ? ?3.  Follow-up: will be in the next few 6 to 8 weeks weeks to follow his progress. ? ?60  minutes were dedicated to this visit. The time was spent on reviewing laboratory data, imaging  studies, discussing treatment options,  and answering questions regarding future plan. ? ? ? A copy of this consult has been forwarded to the requesting physician. ? ?

## 2022-01-06 MED ORDER — PREDNISONE 5 MG PO TABS: 5.0000 mg | ORAL_TABLET | Freq: Every day | ORAL | 3 refills | Status: DC

## 2022-01-07 NOTE — Telephone Encounter (Signed)
Oral Chemotherapy Pharmacist Encounter ? ?I spoke with patient for overview of: Zytiga for the treatment of advanced, castration-resistant prostate cancer in conjunction with prednisone, planned duration until disease progression or unacceptable toxicity.  ? ?Counseled patient on administration, dosing, side effects, monitoring, drug-food interactions, safe handling, storage, and disposal. ? ?Patient will take Zytiga '250mg'$  tablets, 4 tablets ('1000mg'$ ) by mouth once daily on an empty stomach, 1 hour before or 2 hours after a meal. ? ?Patient states he will take his Zytiga 2 hours after breakfast and will wait at least 1 hour before eating. ? ?Patient will take prednisone '5mg'$  tablet, 1 tablet by mouth one daily with breakfast. ? ?Zytiga start date: 01/09/2022 ? ?Adverse effects include but are not limited to: peripheral edema, GI upset, hypertension, hot flashes, fatigue, and arthralgias.   ? ?Prednisone prescription has been sent to [facility] on 01/06/22. ?Patient will obtain prednisone and knows to start prednisone on the same day as Zytiga start. ? ?Reviewed with patient importance of keeping a medication schedule and plan for any missed doses. No barriers to medication adherence identified. ? ?Medication reconciliation performed and medication/allergy list updated. ? ?Ship broker for Cody [name] has been obtained. ?Patient fills medication through [facility].  ? ?Patient informed the pharmacy will reach out 5-7 days prior to needing next fill of Zytiga to coordinate continued medication acquisition to prevent break in therapy. ? ?All questions answered. ? ?Cody [name] voiced understanding and appreciation.  ? ?Medication education handout placed in mail for patient. Patient knows to call the office with questions or concerns. Oral Chemotherapy Clinic phone number provided to patient.  ? ?Drema Halon, PharmD ?Hematology/Oncology Clinical Pharmacist ?[facility] ?[phone] ?01/07/2022 8:19 AM ? ?

## 2022-01-29 ENCOUNTER — Other Ambulatory Visit: Payer: Self-pay | Admitting: Oncology

## 2022-01-29 DIAGNOSIS — C61 Malignant neoplasm of prostate: Secondary | ICD-10-CM

## 2022-02-05 ENCOUNTER — Telehealth: Payer: Self-pay | Admitting: *Deleted

## 2022-02-05 NOTE — Telephone Encounter (Signed)
-----   Message from Wyatt Portela, MD sent at 02/05/2022  2:29 PM EDT ----- ?Regarding: RE: Side effects of zytiga ?He needs to hold it till I see him again ?----- Message ----- ?From: Rolene Course, RN ?Sent: 02/05/2022   1:33 PM EDT ?To: Wyatt Portela, MD ?Subject: Side effects of zytiga                        ? ?This patient called & said he is having severe side effects from the zytiga which he started taking in April.  He reports no appetite for 3 days, migraine HA, back pain, he has a knot under his arm.  He is also very tired & weak.  He denies any nausea, vomiting or diarrhea.  Please advise. ? ?Thanks, ?Bethena Roys ? ? ?

## 2022-02-05 NOTE — Telephone Encounter (Signed)
Returned PC to patient, informed him that Dr. Alen Blew recommends he hold the zytiga until he comes in for his MD appointment on 02/19/22 & they will discuss options at that time.  Patient verbalizes understanding. ?

## 2022-02-19 ENCOUNTER — Encounter: Payer: Self-pay | Admitting: Oncology

## 2022-02-19 ENCOUNTER — Inpatient Hospital Stay: Payer: 59 | Attending: Oncology

## 2022-02-19 ENCOUNTER — Other Ambulatory Visit: Payer: Self-pay

## 2022-02-19 ENCOUNTER — Inpatient Hospital Stay (HOSPITAL_BASED_OUTPATIENT_CLINIC_OR_DEPARTMENT_OTHER): Payer: 59 | Admitting: Oncology

## 2022-02-19 VITALS — BP 132/79 | HR 82 | Temp 97.8°F | Resp 17 | Ht 70.0 in | Wt 138.5 lb

## 2022-02-19 DIAGNOSIS — C61 Malignant neoplasm of prostate: Secondary | ICD-10-CM | POA: Insufficient documentation

## 2022-02-19 DIAGNOSIS — Z9079 Acquired absence of other genital organ(s): Secondary | ICD-10-CM | POA: Diagnosis not present

## 2022-02-19 DIAGNOSIS — C786 Secondary malignant neoplasm of retroperitoneum and peritoneum: Secondary | ICD-10-CM | POA: Insufficient documentation

## 2022-02-19 DIAGNOSIS — R519 Headache, unspecified: Secondary | ICD-10-CM | POA: Diagnosis not present

## 2022-02-19 DIAGNOSIS — R630 Anorexia: Secondary | ICD-10-CM | POA: Insufficient documentation

## 2022-02-19 DIAGNOSIS — E878 Other disorders of electrolyte and fluid balance, not elsewhere classified: Secondary | ICD-10-CM | POA: Insufficient documentation

## 2022-02-19 DIAGNOSIS — R5383 Other fatigue: Secondary | ICD-10-CM | POA: Insufficient documentation

## 2022-02-19 DIAGNOSIS — I10 Essential (primary) hypertension: Secondary | ICD-10-CM | POA: Insufficient documentation

## 2022-02-19 LAB — CMP (CANCER CENTER ONLY)
ALT: 21 U/L (ref 0–44)
AST: 20 U/L (ref 15–41)
Albumin: 3.9 g/dL (ref 3.5–5.0)
Alkaline Phosphatase: 85 U/L (ref 38–126)
Anion gap: 8 (ref 5–15)
BUN: 19 mg/dL (ref 8–23)
CO2: 27 mmol/L (ref 22–32)
Calcium: 9.4 mg/dL (ref 8.9–10.3)
Chloride: 105 mmol/L (ref 98–111)
Creatinine: 0.84 mg/dL (ref 0.61–1.24)
GFR, Estimated: >60 mL/min (ref >60)
Glucose, Bld: 111 mg/dL — ABNORMAL HIGH (ref 70–99)
Potassium: 3.5 mmol/L (ref 3.5–5.1)
Sodium: 140 mmol/L (ref 135–145)
Total Bilirubin: 0.4 mg/dL (ref 0.3–1.2)
Total Protein: 7.2 g/dL (ref 6.5–8.1)

## 2022-02-19 LAB — CBC WITH DIFFERENTIAL (CANCER CENTER ONLY)
Abs Immature Granulocytes: 0.04 10*3/uL (ref 0.00–0.07)
Basophils Absolute: 0.1 10*3/uL (ref 0.0–0.1)
Basophils Relative: 0 %
Eosinophils Absolute: 0.4 10*3/uL (ref 0.0–0.5)
Eosinophils Relative: 4 %
HCT: 37.9 % — ABNORMAL LOW (ref 39.0–52.0)
Hemoglobin: 12.5 g/dL — ABNORMAL LOW (ref 13.0–17.0)
Immature Granulocytes: 0 %
Lymphocytes Relative: 26 %
Lymphs Abs: 3 10*3/uL (ref 0.7–4.0)
MCH: 28.9 pg (ref 26.0–34.0)
MCHC: 33 g/dL (ref 30.0–36.0)
MCV: 87.5 fL (ref 80.0–100.0)
Monocytes Absolute: 0.9 10*3/uL (ref 0.1–1.0)
Monocytes Relative: 8 %
Neutro Abs: 7.2 10*3/uL (ref 1.7–7.7)
Neutrophils Relative %: 62 %
Platelet Count: 251 10*3/uL (ref 150–400)
RBC: 4.33 MIL/uL (ref 4.22–5.81)
RDW: 13.6 % (ref 11.5–15.5)
WBC Count: 11.6 10*3/uL — ABNORMAL HIGH (ref 4.0–10.5)
nRBC: 0 % (ref 0.0–0.2)

## 2022-02-19 NOTE — Progress Notes (Signed)
Hematology and Oncology Follow Up Visit ? ?Cody [name] ?[id number] ?1956-01-11 66 y.o. ?02/19/2022 2:48 PM ?Zackery Barefoot, MD  ? 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  Initially was found to have prostate cancer 2010 with a PSA of 10 and a Gleason score 3+4 = 7 ? ? ?Prior Therapy: ? ?He is status post radical prostatectomy on April 26 and 2012.  He final pathological staging showed a Gleason score of 3+4 =7.   ? ?His PSA started to rise in 2012 but failed to reestablish care until 2016 with a PSA up to 21.82.  He was started on androgen deprivation therapy since that time and has been receiving it intermittently.   ? ?It was restarted in 2018 and stopped in 2019 and subsequently restarted in March 2020.  His PSA nadired to 0.15 in June 2020 but started to rise since that time.  His PSA on November 13, 2021 was 3.22 which is a rise from 1.0 in May 2022 and 2.15 from November 2022.  His testosterone level showed near castrate level at that time. ?  ?Staging scans including a PSMA PET scan in February 2028 showed peritoneal involvement predominantly in the pelvis. ? ?Current therapy:  ? ?Androgen deprivation therapy under the care of Dr. Alinda Money. ? ?Zytiga 1000 mg daily with prednisone started on January 09, 2022. ? ?Interim History: Mr. Hatchel returns today for a follow-up visit.  Since the last visit, he started taking Zytiga and experienced multiple complaints.  He reported headaches and fatigue as well as loss of appetite.  He had discontinued this medication in the last 2 weeks with improvement in his symptoms.  He has regained all activities of daily living without any issues.  He denies any nausea, vomiting or abdominal pain.  He is eating better at this time. ? ? ? ? ?Medications: I have reviewed the patient's current medications.  ?Current Outpatient Medications  ?Medication Sig Dispense Refill  ? abiraterone acetate (ZYTIGA) 250  MG tablet TAKE 4 TABLETS (1,'000MG'$ ) BY  MOUTH ONCE DAILY ON AN EMPTY  STOMACH 1 HOUR BEFORE OR 2 HOURS AFTER A MEAL 120 tablet 0  ? meloxicam (MOBIC) 7.5 MG tablet Take 1 tablet (7.5 mg total) by mouth daily. 21 tablet 1  ? predniSONE (DELTASONE) 5 MG tablet Take 1 tablet (5 mg total) by mouth daily with breakfast. 90 tablet 3  ? ?No current facility-administered medications for this visit.  ? ? ? ?Allergies: No Known Allergies ? ? ?Physical Exam: ?Blood pressure 132/79, pulse 82, temperature 97.8 ?F (36.6 ?C), temperature source Temporal, resp. rate 17, height '5\' 10"'$  (1.778 m), weight 138 lb 8 oz (62.8 kg), SpO2 100 %. ? ?ECOG: 1 ? ? ?General appearance: Comfortable appearing without any discomfort ?Head: Normocephalic without any trauma ?Oropharynx: Mucous membranes are moist and pink without any thrush or ulcers. ?Eyes: Pupils are equal and round reactive to light. ?Lymph nodes: No cervical, supraclavicular, inguinal or axillary lymphadenopathy.   ?Heart:regular rate and rhythm.  S1 and S2 without leg edema. ?Lung: Clear without any rhonchi or wheezes.  No dullness to percussion. ?Abdomin: Soft, nontender, nondistended with good bowel sounds.  No hepatosplenomegaly. ?Musculoskeletal: No joint deformity or effusion.  Full range of motion noted. ?Neurological: No deficits noted on motor, sensory and deep tendon reflex exam. ?Skin: No petechial rash or dryness.  Appeared moist.  ? ? ? ?Lab Results: ?Lab Results  ?Component Value Date  ? WBC 8.5 05/05/2017  ?  HGB 14.0 05/05/2017  ? HCT 42.2 05/05/2017  ? MCV 85 05/05/2017  ? PLT 246 05/05/2017  ? ?  Chemistry   ?   ?Component Value Date/Time  ? NA 139 03/04/2019 1001  ? K 4.3 03/04/2019 1001  ? CL 101 03/04/2019 1001  ? CO2 24 03/04/2019 1001  ? BUN 21 03/04/2019 1001  ? CREATININE 0.93 03/04/2019 1001  ?    ?Component Value Date/Time  ? CALCIUM 9.5 03/04/2019 1001  ? ALKPHOS 103 03/04/2019 1001  ? AST 29 03/04/2019 1001  ? ALT 23 03/04/2019 1001  ? BILITOT <0.2  03/04/2019 1001  ?  ? ? ? ? ? ?Impression and Plan: ? ?66 year old with: ? ?1.  Advanced prostate cancer with peritoneal involvement documented in February 2023.  He has castration-resistant disease at this time. ?  ?He is currently on Zytiga and prednisone started in April 2023.  Long-term complications that include fatigue, hypertension on others were reiterated.  Alternative treatment options including systemic chemotherapy PARP intubation were also reviewed. ? ?After discussion today, we opted to proceed with Zytiga at 500 mg daily dose and let me know if his symptoms in the near future.  If he continues to have complications we will switch to [place] at this time. ?  ?2.  Androgen deprivation therapy: He is currently receiving that under the care of Dr. Alinda Money.  This will be continued indefinitely. ? ?3.  Hypertension: We will continue to monitor on Zytiga. ? ?4.  Electrolyte imbalance and abnormalities liver function test: He is will be monitored while on Zytiga. ?  ?5.  Follow-up: In 4 to 6 weeks for repeat follow-up. ?  ?30  minutes were spent on this encounter.  The time was dedicated to reviewing his disease status, treatment choices and future plan of care discussion. ?  ?  ? ? ? ?Zola Button, MD ?5/17/20232:48 PM ? ?

## 2022-02-20 LAB — PROSTATE-SPECIFIC AG, SERUM (LABCORP): Prostate Specific Ag, Serum: 0.2 ng/mL (ref 0.0–4.0)

## 2022-02-21 ENCOUNTER — Other Ambulatory Visit: Payer: Self-pay | Admitting: Oncology

## 2022-02-21 ENCOUNTER — Telehealth: Payer: Self-pay | Admitting: *Deleted

## 2022-02-21 DIAGNOSIS — C61 Malignant neoplasm of prostate: Secondary | ICD-10-CM

## 2022-02-21 NOTE — Telephone Encounter (Signed)
PC to patient, no answer, left VM - informed patient his PSA is 0.2   Instructed patient to call this office with any questions/concerns, [phone].

## 2022-02-21 NOTE — Telephone Encounter (Signed)
-----   Message from Wyatt Portela, MD sent at 02/20/2022  2:40 PM EDT ----- Please let him know his PSA is down

## 2022-03-04 ENCOUNTER — Encounter: Payer: Self-pay | Admitting: Oncology

## 2022-03-04 ENCOUNTER — Telehealth: Payer: Self-pay | Admitting: Oncology

## 2022-03-04 NOTE — Telephone Encounter (Signed)
Called patient regarding upcoming appointments, patient is notified. 

## 2022-03-17 LAB — GUARDANT 360

## 2022-03-22 ENCOUNTER — Other Ambulatory Visit: Payer: Self-pay | Admitting: Oncology

## 2022-03-22 DIAGNOSIS — C61 Malignant neoplasm of prostate: Secondary | ICD-10-CM

## 2022-04-03 ENCOUNTER — Inpatient Hospital Stay (HOSPITAL_BASED_OUTPATIENT_CLINIC_OR_DEPARTMENT_OTHER): Payer: 59 | Admitting: Oncology

## 2022-04-03 ENCOUNTER — Inpatient Hospital Stay: Payer: 59 | Attending: Oncology

## 2022-04-03 ENCOUNTER — Other Ambulatory Visit: Payer: Self-pay

## 2022-04-03 VITALS — BP 151/77 | HR 68 | Temp 97.5°F | Resp 15 | Wt 136.9 lb

## 2022-04-03 DIAGNOSIS — R5383 Other fatigue: Secondary | ICD-10-CM | POA: Insufficient documentation

## 2022-04-03 DIAGNOSIS — C61 Malignant neoplasm of prostate: Secondary | ICD-10-CM | POA: Insufficient documentation

## 2022-04-03 DIAGNOSIS — Z9079 Acquired absence of other genital organ(s): Secondary | ICD-10-CM | POA: Insufficient documentation

## 2022-04-03 DIAGNOSIS — C786 Secondary malignant neoplasm of retroperitoneum and peritoneum: Secondary | ICD-10-CM | POA: Diagnosis present

## 2022-04-03 DIAGNOSIS — E878 Other disorders of electrolyte and fluid balance, not elsewhere classified: Secondary | ICD-10-CM | POA: Insufficient documentation

## 2022-04-03 DIAGNOSIS — I10 Essential (primary) hypertension: Secondary | ICD-10-CM | POA: Diagnosis not present

## 2022-04-03 LAB — CBC WITH DIFFERENTIAL (CANCER CENTER ONLY)
Abs Immature Granulocytes: 0.01 10*3/uL (ref 0.00–0.07)
Basophils Absolute: 0.1 10*3/uL (ref 0.0–0.1)
Basophils Relative: 1 %
Eosinophils Absolute: 0.4 10*3/uL (ref 0.0–0.5)
Eosinophils Relative: 4 %
HCT: 35.5 % — ABNORMAL LOW (ref 39.0–52.0)
Hemoglobin: 12 g/dL — ABNORMAL LOW (ref 13.0–17.0)
Immature Granulocytes: 0 %
Lymphocytes Relative: 30 %
Lymphs Abs: 2.6 10*3/uL (ref 0.7–4.0)
MCH: 29.1 pg (ref 26.0–34.0)
MCHC: 33.8 g/dL (ref 30.0–36.0)
MCV: 86.2 fL (ref 80.0–100.0)
Monocytes Absolute: 0.8 10*3/uL (ref 0.1–1.0)
Monocytes Relative: 10 %
Neutro Abs: 4.8 10*3/uL (ref 1.7–7.7)
Neutrophils Relative %: 55 %
Platelet Count: 243 10*3/uL (ref 150–400)
RBC: 4.12 MIL/uL — ABNORMAL LOW (ref 4.22–5.81)
RDW: 13.5 % (ref 11.5–15.5)
WBC Count: 8.7 10*3/uL (ref 4.0–10.5)
nRBC: 0 % (ref 0.0–0.2)

## 2022-04-03 LAB — CMP (CANCER CENTER ONLY)
ALT: 60 U/L — ABNORMAL HIGH (ref 0–44)
AST: 44 U/L — ABNORMAL HIGH (ref 15–41)
Albumin: 4.3 g/dL (ref 3.5–5.0)
Alkaline Phosphatase: 91 U/L (ref 38–126)
Anion gap: 6 (ref 5–15)
BUN: 17 mg/dL (ref 8–23)
CO2: 35 mmol/L — ABNORMAL HIGH (ref 22–32)
Calcium: 9.7 mg/dL (ref 8.9–10.3)
Chloride: 102 mmol/L (ref 98–111)
Creatinine: 0.96 mg/dL (ref 0.61–1.24)
GFR, Estimated: >60 mL/min (ref >60)
Glucose, Bld: 104 mg/dL — ABNORMAL HIGH (ref 70–99)
Potassium: 3.1 mmol/L — ABNORMAL LOW (ref 3.5–5.1)
Sodium: 143 mmol/L (ref 135–145)
Total Bilirubin: 0.5 mg/dL (ref 0.3–1.2)
Total Protein: 7 g/dL (ref 6.5–8.1)

## 2022-04-03 NOTE — Progress Notes (Signed)
Hematology and Oncology Follow Up Visit  Cody [name] [id number] [birth year] 66 y.o. 04/03/2022 3:33 PM Pcp, Cody Dura, MD   Principle Diagnosis: 67 year old man with prostate cancer diagnosed in 2010 with a PSA of 10 and a Gleason score 3+4 = 7.  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    Prior Therapy:  He is status post radical prostatectomy on April 26 and 2012.  He final pathological staging showed a Gleason score of 3+4 =7.    His PSA started to rise in 2012 but failed to reestablish care until 2016 with a PSA up to 21.82.  He was started on androgen deprivation therapy since that time and has been receiving it intermittently.    It was restarted in 2018 and stopped in 2019 and subsequently restarted in March 2020.  His PSA nadired to 0.15 in June 2020 but started to rise since that time.  His PSA on November 13, 2021 was 3.22 which is a rise from 1.0 in May 2022 and 2.15 from November 2022.  His testosterone level showed near castrate level at that time.   Staging scans including a PSMA PET scan in February 2028 showed peritoneal involvement predominantly in the pelvis.  Current therapy:   Androgen deprivation therapy under the care of Dr. Alinda [name].  Zytiga 1000 mg daily with prednisone started on January 09, 2022.  His dose was reduced to 500 mg in May 2023.  Interim History: Cody [name] is here for a follow-up visit.  Since last visit, he reports feeling well without any major complaints.  He has tolerated the dose reduction of Zytiga without any complaints.  He denies any nausea, vomiting or abdominal pain.  He denies any hospitalizations or illnesses.  He denies any excessive fatigue or tiredness.  He denies any bone pain or pathological fractures.  He denies any lower extremity edema weight loss.     Medications: Reviewed without changes. Current Outpatient Medications  Medication Sig Dispense Refill    abiraterone acetate (ZYTIGA) 250 MG tablet TAKE 4 TABLETS (1,'000MG'$ ) BY  MOUTH ONCE DAILY ON AN EMPTY  STOMACH 1 HOUR BEFORE OR 2 HOURS AFTER A MEAL 120 tablet 0   meloxicam (MOBIC) 7.5 MG tablet Take 1 tablet (7.5 mg total) by mouth daily. 21 tablet 1   predniSONE (DELTASONE) 5 MG tablet Take 1 tablet (5 mg total) by mouth daily with breakfast. 90 tablet 3   No current facility-administered medications for this visit.     Allergies: No Known Allergies   Physical Exam: Blood pressure (!) 151/77, pulse 68, temperature (!) 97.5 F (36.4 C), temperature source Temporal, resp. rate 15, weight 136 lb 14.4 oz (62.1 kg), SpO2 100 %.  ECOG: 1    General appearance: Alert, awake without any distress. Head: Atraumatic without abnormalities Oropharynx: Without any thrush or ulcers. Eyes: No scleral icterus. Lymph nodes: No lymphadenopathy noted in the cervical, supraclavicular, or axillary nodes Heart:regular rate and rhythm, without any murmurs or gallops.   Lung: Clear to auscultation without any rhonchi, wheezes or dullness to percussion. Abdomin: Soft, nontender without any shifting dullness or ascites. Musculoskeletal: No clubbing or cyanosis. Neurological: No motor or sensory deficits. Skin: No rashes or lesions.      Lab Results: Lab Results  Component Value Date   WBC 8.7 04/03/2022   HGB 12.0 (L) 04/03/2022   HCT 35.5 (L) 04/03/2022   MCV 86.2 04/03/2022   PLT 243 04/03/2022     Chemistry  Component Value Date/Time   NA 140 02/19/2022 1459   NA 139 03/04/2019 1001   K 3.5 02/19/2022 1459   CL 105 02/19/2022 1459   CO2 27 02/19/2022 1459   BUN 19 02/19/2022 1459   BUN 21 03/04/2019 1001   CREATININE 0.84 02/19/2022 1459      Component Value Date/Time   CALCIUM 9.4 02/19/2022 1459   ALKPHOS 85 02/19/2022 1459   AST 20 02/19/2022 1459   ALT 21 02/19/2022 1459   BILITOT 0.4 02/19/2022 1459       Latest Reference Range & Units 05/05/17 09:42 02/19/22 14:59   Prostate Specific Ag, Serum 0.0 - 4.0 ng/mL 6.4 (H) 0.2  (H): Data is abnormally high   Impression and Plan:  66 year old with:  1.  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     He is currently on Zytiga which she has tolerated very well without any complications.  His PSA showed a excellent response down to 0.2.  Risks and benefits of continuing this treatment were discussed.  Complications including hypertension, edema and electrolyte imbalance were reviewed.   2.  Androgen deprivation therapy: I recommended continuing this indefinitely.  He is receiving that under the care of Dr. Alinda [name].  3.  Hypertension: Mildly elevated but overall under control.  4.  Electrolyte imbalance and abnormalities liver function test: No issues reported at this time with normal potassium and liver function test on previous testing.  We will continue to monitor.   5.  Follow-up: He will follow-up visit.   30  minutes were dedicated to this visit.  The time was spent on reviewing laboratory data, disease status update and outlining future plan of care discussion.        Cody Button, MD 6/29/20233:33 PM

## 2022-04-04 ENCOUNTER — Telehealth: Payer: Self-pay

## 2022-04-04 LAB — PROSTATE-SPECIFIC AG, SERUM (LABCORP): Prostate Specific Ag, Serum: <0.1 ng/mL (ref 0.0–4.0)

## 2022-04-04 NOTE — Telephone Encounter (Addendum)
  TCT to patient. Left message on VM advising of message below. Left clinic number for questions or concerns.  ----- Message from Wyatt Portela, MD sent at 04/04/2022  9:44 AM EDT ----- Please let him know his PSA is down

## 2022-04-16 ENCOUNTER — Other Ambulatory Visit: Payer: Self-pay | Admitting: Oncology

## 2022-04-16 ENCOUNTER — Telehealth: Payer: Self-pay | Admitting: Oncology

## 2022-04-16 NOTE — Progress Notes (Signed)
I received communication from Dr. Alinda Money regarding Mr. Cody [name].  He will continue to receive that every 6 months with the next injection scheduled tentatively in October 17, 2022.  He will receive subsequent treatments at the cancer center to consolidate care.

## 2022-04-16 NOTE — Telephone Encounter (Signed)
Scheduled per 06/29 los, patient has been called and voicemail was left.

## 2022-05-08 ENCOUNTER — Other Ambulatory Visit: Payer: Self-pay | Admitting: Oncology

## 2022-05-08 DIAGNOSIS — C61 Malignant neoplasm of prostate: Secondary | ICD-10-CM

## 2022-05-28 ENCOUNTER — Ambulatory Visit: Payer: 59 | Admitting: Cardiology

## 2022-05-28 ENCOUNTER — Encounter: Payer: Self-pay | Admitting: Cardiology

## 2022-05-28 ENCOUNTER — Encounter: Payer: Self-pay | Admitting: Oncology

## 2022-05-28 VITALS — BP 188/93 | HR 61 | Temp 98.0°F | Resp 16 | Ht 70.0 in | Wt 138.0 lb

## 2022-05-28 DIAGNOSIS — R9431 Abnormal electrocardiogram [ECG] [EKG]: Secondary | ICD-10-CM | POA: Insufficient documentation

## 2022-05-28 DIAGNOSIS — I493 Ventricular premature depolarization: Secondary | ICD-10-CM

## 2022-05-28 NOTE — Progress Notes (Signed)
Patient referred by Carol Ada, MD for abnormal EKG  Subjective:   Cody [name], male    DOB: [birth year], 66 y.o.   MRN: [id number]   Chief Complaint  Patient presents with   Hypertension   New Patient (Initial Visit)     HPI  66 y.o. African-American male with h/o prostate cancer, referred for evaluation of abnormal EKG.  Patient was recently going to undergo colonoscopy with Dr. Almyra Free for follow-up.  At the time of colonoscopy, he was noted to have ventricular bigeminy.  EKG reviewed by me, but copy of the same not available today.  Patient denies any chest pain, shortness of breath, palpitations, presyncope or syncope.  He works in [employer],, and walks a lot at his job.   Past Medical History:  Diagnosis Date   Cancer [facility])    Prostate     Past Surgical History:  Procedure Laterality Date   PROSTATE SURGERY     TONSILLECTOMY       Social History   Tobacco Use  Smoking Status Never  Smokeless Tobacco Never    Social History   Substance and Sexual Activity  Alcohol Use Yes     History reviewed. No pertinent family history.    Current Outpatient Medications:    abiraterone acetate (ZYTIGA) 250 MG tablet, TAKE 4 TABLETS (1,$RemoveBefo'000MG'UyJhLnsMpgp$ ) BY  MOUTH ONCE DAILY ON AN EMPTY  STOMACH 1 HOUR BEFORE OR 2 HOURS AFTER A MEAL, Disp: 120 tablet, Rfl: 0   Na Sulfate-K Sulfate-Mg Sulf 17.5-3.13-1.6 GM/177ML SOLN, Take by mouth as directed., Disp: , Rfl:    Cardiovascular and other pertinent studies:  Reviewed external labs and tests, independently interpreted  EKG 05/28/2022: Sinus rhythm 60 bpm  Left atrial enlargement Left ventricular hypertrophy Nonspecific ST-T abnormality   Recent labs: 04/03/2022: Glucose 104, BUN/Cr 17/0.96. EGFR >60. Na/K 143/3.1. AST/ALT. H/H 12/35. MCV 86. Platelets 243     Review of Systems  Cardiovascular:  Negative for chest pain, dyspnea on exertion, leg swelling, palpitations and syncope.         Vitals:    05/28/22 1304  BP: (!) 188/93  Pulse: 61  Resp: 16  Temp: 98 F (36.7 C)  SpO2: 99%     Body mass index is 19.8 kg/m. Filed Weights   05/28/22 1304  Weight: 138 lb (62.6 kg)     Objective:   Physical Exam Vitals and nursing note reviewed.  Constitutional:      General: He is not in acute distress. Neck:     Vascular: No JVD.  Cardiovascular:     Rate and Rhythm: Normal rate and regular rhythm.     Heart sounds: Normal heart sounds. No murmur heard. Pulmonary:     Effort: Pulmonary effort is normal.     Breath sounds: Normal breath sounds. No wheezing or rales.  Musculoskeletal:     Right lower leg: No edema.     Left lower leg: No edema.          Visit diagnoses:   ICD-10-CM   1. Abnormal EKG  R94.31 EKG 12-Lead    2. PVC (premature ventricular contraction)  I49.3 PCV ECHOCARDIOGRAM COMPLETE    Basic metabolic panel    Lipid panel       Orders Placed This Encounter  Procedures   EKG 12-Lead      Assessment & Recommendations:    66 y.o. African-American male with h/o prostate cancer, referred for evaluation of abnormal EKG.  Abnormal EKG: Admitted  to bigeminy noted at the time of colonoscopy.  Colonoscopy was therefore canceled.  I suspect this could have been due to hypokalemia, which is previously noted as recently as 03/2022.  Check BMP again.  Check echocardiogram.  Today's EKG does not show any PVC.  If structurally normal heart, I do not see any contraindication to proceeding with colonoscopy.  If potassium remains low, could add some potassium supplements.  Low suspicion for any renal syndromes associated with hypokalemia, in absence of hypertension.   Thank you for referring the patient to [place]. Please feel free to contact with any questions.   Nigel Mormon, MD Pager: [phone] Office: [phone]

## 2022-06-06 LAB — LIPID PANEL
Chol/HDL Ratio: 4 ratio (ref 0.0–5.0)
Cholesterol, Total: 180 mg/dL (ref 100–199)
HDL: 45 mg/dL (ref >39)
LDL Chol Calc (NIH): 114 mg/dL — ABNORMAL HIGH (ref 0–99)
Triglycerides: 119 mg/dL (ref 0–149)
VLDL Cholesterol Cal: 21 mg/dL (ref 5–40)

## 2022-06-06 LAB — BASIC METABOLIC PANEL
BUN/Creatinine Ratio: 17 (ref 10–24)
BUN: 15 mg/dL (ref 8–27)
CO2: 28 mmol/L (ref 20–29)
Calcium: 9.6 mg/dL (ref 8.6–10.2)
Chloride: 98 mmol/L (ref 96–106)
Creatinine, Ser: 0.9 mg/dL (ref 0.76–1.27)
Glucose: 104 mg/dL — ABNORMAL HIGH (ref 70–99)
Potassium: 3.5 mmol/L (ref 3.5–5.2)
Sodium: 143 mmol/L (ref 134–144)
eGFR: 93 mL/min/{1.73_m2} (ref >59)

## 2022-06-10 ENCOUNTER — Inpatient Hospital Stay: Payer: 59 | Attending: Oncology

## 2022-06-10 ENCOUNTER — Inpatient Hospital Stay (HOSPITAL_BASED_OUTPATIENT_CLINIC_OR_DEPARTMENT_OTHER): Payer: 59 | Admitting: Oncology

## 2022-06-10 ENCOUNTER — Other Ambulatory Visit: Payer: Self-pay | Admitting: *Deleted

## 2022-06-10 ENCOUNTER — Other Ambulatory Visit: Payer: Self-pay

## 2022-06-10 ENCOUNTER — Telehealth: Payer: Self-pay | Admitting: *Deleted

## 2022-06-10 VITALS — BP 162/79 | HR 60 | Temp 98.4°F | Resp 18 | Ht 70.0 in | Wt 140.3 lb

## 2022-06-10 DIAGNOSIS — C61 Malignant neoplasm of prostate: Secondary | ICD-10-CM | POA: Insufficient documentation

## 2022-06-10 DIAGNOSIS — R5383 Other fatigue: Secondary | ICD-10-CM | POA: Diagnosis not present

## 2022-06-10 DIAGNOSIS — I10 Essential (primary) hypertension: Secondary | ICD-10-CM | POA: Insufficient documentation

## 2022-06-10 DIAGNOSIS — E878 Other disorders of electrolyte and fluid balance, not elsewhere classified: Secondary | ICD-10-CM | POA: Insufficient documentation

## 2022-06-10 DIAGNOSIS — C786 Secondary malignant neoplasm of retroperitoneum and peritoneum: Secondary | ICD-10-CM | POA: Diagnosis present

## 2022-06-10 DIAGNOSIS — Z9079 Acquired absence of other genital organ(s): Secondary | ICD-10-CM | POA: Diagnosis not present

## 2022-06-10 DIAGNOSIS — E876 Hypokalemia: Secondary | ICD-10-CM

## 2022-06-10 LAB — CBC WITH DIFFERENTIAL (CANCER CENTER ONLY)
Abs Immature Granulocytes: 0.02 10*3/uL (ref 0.00–0.07)
Basophils Absolute: 0 10*3/uL (ref 0.0–0.1)
Basophils Relative: 1 %
Eosinophils Absolute: 0.4 10*3/uL (ref 0.0–0.5)
Eosinophils Relative: 5 %
HCT: 35.3 % — ABNORMAL LOW (ref 39.0–52.0)
Hemoglobin: 12.2 g/dL — ABNORMAL LOW (ref 13.0–17.0)
Immature Granulocytes: 0 %
Lymphocytes Relative: 29 %
Lymphs Abs: 2.3 10*3/uL (ref 0.7–4.0)
MCH: 29.2 pg (ref 26.0–34.0)
MCHC: 34.6 g/dL (ref 30.0–36.0)
MCV: 84.4 fL (ref 80.0–100.0)
Monocytes Absolute: 0.6 10*3/uL (ref 0.1–1.0)
Monocytes Relative: 7 %
Neutro Abs: 4.8 10*3/uL (ref 1.7–7.7)
Neutrophils Relative %: 58 %
Platelet Count: 197 10*3/uL (ref 150–400)
RBC: 4.18 MIL/uL — ABNORMAL LOW (ref 4.22–5.81)
RDW: 13.9 % (ref 11.5–15.5)
WBC Count: 8.1 10*3/uL (ref 4.0–10.5)
nRBC: 0 % (ref 0.0–0.2)

## 2022-06-10 LAB — CMP (CANCER CENTER ONLY)
ALT: 79 U/L — ABNORMAL HIGH (ref 0–44)
AST: 52 U/L — ABNORMAL HIGH (ref 15–41)
Albumin: 3.9 g/dL (ref 3.5–5.0)
Alkaline Phosphatase: 101 U/L (ref 38–126)
Anion gap: 3 — ABNORMAL LOW (ref 5–15)
BUN: 17 mg/dL (ref 8–23)
CO2: 33 mmol/L — ABNORMAL HIGH (ref 22–32)
Calcium: 9.3 mg/dL (ref 8.9–10.3)
Chloride: 105 mmol/L (ref 98–111)
Creatinine: 0.92 mg/dL (ref 0.61–1.24)
GFR, Estimated: >60 mL/min (ref >60)
Glucose, Bld: 96 mg/dL (ref 70–99)
Potassium: 2.9 mmol/L — ABNORMAL LOW (ref 3.5–5.1)
Sodium: 141 mmol/L (ref 135–145)
Total Bilirubin: 0.4 mg/dL (ref 0.3–1.2)
Total Protein: 6.3 g/dL — ABNORMAL LOW (ref 6.5–8.1)

## 2022-06-10 MED ORDER — POTASSIUM CHLORIDE CRYS ER 20 MEQ PO TBCR: 20.0000 meq | EXTENDED_RELEASE_TABLET | Freq: Every day | ORAL | 0 refills | Status: DC

## 2022-06-10 NOTE — Telephone Encounter (Signed)
-----   Message from Wyatt Portela, MD sent at 06/10/2022  3:41 PM EDT ----- Please let him know his K is low. He needs Kdur 20 meq daily for 30 days.

## 2022-06-10 NOTE — Telephone Encounter (Signed)
PC to patient, informed him his K is 2.9 & that a rx for a potassium supplement has been sent to his pharmacy, he is to take K-Dur 20 meq daily for 30 days.  Patient verbalizes understanding.

## 2022-06-10 NOTE — Progress Notes (Signed)
Hematology and [name] Follow Up Visit  BREVYN RING [id number] 01/10/1956 66 y.o. 06/10/2022 3:02 PM Pcp, Tacy Dura, MD   Principle Diagnosis: 66 year old man with castration-resistant advanced prostate cancer documented in 2023 without peritoneal involvement.  He initially was diagnosed in 2010 with a PSA of 10 and a Gleason score 3+4 = 7.  Prior Therapy:  He is status post radical prostatectomy on April 26 and 2012.  He final pathological staging showed a Gleason score of 3+4 =7.    His PSA started to rise in 2012 but failed to reestablish care until 2016 with a PSA up to 21.82.  He was started on androgen deprivation therapy since that time and has been receiving it intermittently.    It was restarted in 2018 and stopped in 2019 and subsequently restarted in March 2020.  His PSA nadired to 0.15 in June 2020 but started to rise since that time.  His PSA on November 13, 2021 was 3.22 which is a rise from 1.0 in May 2022 and 2.15 from November 2022.  His testosterone level showed near castrate level at that time.   Staging scans including a PSMA PET scan in February 2028 showed peritoneal involvement predominantly in the pelvis.  Current therapy:   Androgen deprivation therapy under the care of Dr. Alinda Money.  His next injection will be given around October 17, 2022.  Zytiga 1000 mg daily with prednisone started on January 09, 2022.  His dose was reduced to 500 mg in May 2023 for better tolerance due to side effects.  Interim History: Mr. Cody [name] returns today for repeat evaluation.  Since last visit, he reports no major changes in his health.  He has tolerated Zytiga at the current dose without any issues.  He denies any nausea, vomiting or abdominal pain.  He denies any hospitalizations or illnesses.  Pulm status quality of life remains unchanged.     Medications: Updated on review. Current Outpatient Medications  Medication Sig Dispense Refill   abiraterone acetate (ZYTIGA) 250 MG  tablet TAKE 4 TABLETS (1,'000MG'$ ) BY  MOUTH ONCE DAILY ON AN EMPTY  STOMACH 1 HOUR BEFORE OR 2 HOURS AFTER A MEAL 120 tablet 0   Na Sulfate-K Sulfate-Mg Sulf 17.5-3.13-1.6 GM/177ML SOLN Take by mouth as directed.     No current facility-administered medications for this visit.     Allergies: No Known Allergies   Physical Exam: Blood pressure (!) 162/79, pulse 60, temperature 98.4 F (36.9 C), temperature source Oral, resp. rate 18, height '5\' 10"'$  (1.778 m), weight 140 lb 4.8 oz (63.6 kg), SpO2 97 %.   ECOG: 1   General appearance: Comfortable appearing without any discomfort Head: Normocephalic without any trauma Oropharynx: Mucous membranes are moist and pink without any thrush or ulcers. Eyes: Pupils are equal and round reactive to light. Lymph nodes: No cervical, supraclavicular, inguinal or axillary lymphadenopathy.   Heart:regular rate and rhythm.  S1 and S2 without leg edema. Lung: Clear without any rhonchi or wheezes.  No dullness to percussion. Abdomin: Soft, nontender, nondistended with good bowel sounds.  No hepatosplenomegaly. Musculoskeletal: No joint deformity or effusion.  Full range of motion noted. Neurological: No deficits noted on motor, sensory and deep tendon reflex exam. Skin: No petechial rash or dryness.  Appeared moist.        Lab Results: Lab Results  Component Value Date   WBC 8.7 04/03/2022   HGB 12.0 (L) 04/03/2022   HCT 35.5 (L) 04/03/2022   MCV 86.2 04/03/2022  PLT 243 04/03/2022     Chemistry      Component Value Date/Time   NA 143 06/05/2022 0837   K 3.5 06/05/2022 0837   CL 98 06/05/2022 0837   CO2 28 06/05/2022 0837   BUN 15 06/05/2022 0837   CREATININE 0.90 06/05/2022 0837   CREATININE 0.96 04/03/2022 1509      Component Value Date/Time   CALCIUM 9.6 06/05/2022 0837   ALKPHOS 91 04/03/2022 1509   AST 44 (H) 04/03/2022 1509   ALT 60 (H) 04/03/2022 1509   BILITOT 0.5 04/03/2022 1509       Latest Reference Range & Units  05/05/17 09:42 02/19/22 14:59 04/03/22 15:09  Prostate Specific Ag, Serum 0.0 - 4.0 ng/mL 6.4 (H) 0.2 <0.1  (H): Data is abnormally high   Impression and Plan:  66 year old with:  1.  Advanced prostate cancer with peritoneal involvement diagnosed in February 2023.  He has castration-resistant disease currently.   His PSA continues to decline appropriately and is currently undetectable.  Risks and benefits of continuing Zytiga at the current dose were discussed.  Complications that include nausea, fatigue, hypertension and adrenal insufficiency were reiterated.  He is agreeable to continue at this time.   2.  Androgen deprivation therapy: Next injection will be given in January 2024.  He will receive that at the cancer center to consolidate his care.  3.  Hypertension: His blood pressure is elevated but has been close to normal range between visits.  We will continue to monitor.  4.  Electrolyte imbalance and abnormalities liver function test: He has mild elevation in his AST and ALT will be repeated today.  Potassium has been continue to monitor.    5.  Follow-up: In 4 months for a follow-up.   30  minutes were dedicated to this encounter.  The time was spent on reviewing laboratory data, disease status update and outlining future plan of care discussion.        Zola Button, MD 9/5/20233:02 PM

## 2022-06-11 ENCOUNTER — Telehealth: Payer: Self-pay | Admitting: *Deleted

## 2022-06-11 LAB — PROSTATE-SPECIFIC AG, SERUM (LABCORP): Prostate Specific Ag, Serum: 0.1 ng/mL (ref 0.0–4.0)

## 2022-06-11 NOTE — Telephone Encounter (Signed)
PC to patient, informed him of PSA level 0.1, he verbalizes understanding.

## 2022-06-11 NOTE — Telephone Encounter (Signed)
-----   Message from Wyatt Portela, MD sent at 06/11/2022  3:49 PM EDT ----- Please let him know his PSA is still low

## 2022-06-27 ENCOUNTER — Ambulatory Visit: Payer: 59

## 2022-06-27 DIAGNOSIS — I493 Ventricular premature depolarization: Secondary | ICD-10-CM

## 2022-07-09 ENCOUNTER — Encounter: Payer: Self-pay | Admitting: Cardiology

## 2022-07-09 NOTE — Telephone Encounter (Signed)
Spoke with patient and he states that he was sent the wrong medical results/Medical records, he states that he doesn't know where the came from,

## 2022-09-02 ENCOUNTER — Other Ambulatory Visit: Payer: Self-pay | Admitting: Oncology

## 2022-09-02 DIAGNOSIS — C61 Malignant neoplasm of prostate: Secondary | ICD-10-CM

## 2022-10-07 ENCOUNTER — Inpatient Hospital Stay: Payer: 59

## 2022-10-07 ENCOUNTER — Inpatient Hospital Stay (HOSPITAL_BASED_OUTPATIENT_CLINIC_OR_DEPARTMENT_OTHER): Payer: 59 | Admitting: Oncology

## 2022-10-07 ENCOUNTER — Inpatient Hospital Stay: Payer: 59 | Attending: Oncology

## 2022-10-07 VITALS — BP 160/81 | HR 72 | Temp 97.8°F | Resp 17 | Ht 70.0 in | Wt 148.2 lb

## 2022-10-07 DIAGNOSIS — C786 Secondary malignant neoplasm of retroperitoneum and peritoneum: Secondary | ICD-10-CM | POA: Diagnosis not present

## 2022-10-07 DIAGNOSIS — I10 Essential (primary) hypertension: Secondary | ICD-10-CM | POA: Diagnosis not present

## 2022-10-07 DIAGNOSIS — Z9079 Acquired absence of other genital organ(s): Secondary | ICD-10-CM | POA: Insufficient documentation

## 2022-10-07 DIAGNOSIS — C61 Malignant neoplasm of prostate: Secondary | ICD-10-CM

## 2022-10-07 LAB — CMP (CANCER CENTER ONLY)
ALT: 40 U/L (ref 0–44)
AST: 30 U/L (ref 15–41)
Albumin: 3.8 g/dL (ref 3.5–5.0)
Alkaline Phosphatase: 86 U/L (ref 38–126)
Anion gap: 4 — ABNORMAL LOW (ref 5–15)
BUN: 19 mg/dL (ref 8–23)
CO2: 33 mmol/L — ABNORMAL HIGH (ref 22–32)
Calcium: 9.4 mg/dL (ref 8.9–10.3)
Chloride: 103 mmol/L (ref 98–111)
Creatinine: 0.92 mg/dL (ref 0.61–1.24)
GFR, Estimated: >60 mL/min (ref >60)
Glucose, Bld: 92 mg/dL (ref 70–99)
Potassium: 3.9 mmol/L (ref 3.5–5.1)
Sodium: 140 mmol/L (ref 135–145)
Total Bilirubin: 0.4 mg/dL (ref 0.3–1.2)
Total Protein: 6.4 g/dL — ABNORMAL LOW (ref 6.5–8.1)

## 2022-10-07 LAB — CBC WITH DIFFERENTIAL (CANCER CENTER ONLY)
Abs Immature Granulocytes: 0.02 10*3/uL (ref 0.00–0.07)
Basophils Absolute: 0.1 10*3/uL (ref 0.0–0.1)
Basophils Relative: 1 %
Eosinophils Absolute: 0.7 10*3/uL — ABNORMAL HIGH (ref 0.0–0.5)
Eosinophils Relative: 7 %
HCT: 37.7 % — ABNORMAL LOW (ref 39.0–52.0)
Hemoglobin: 12.8 g/dL — ABNORMAL LOW (ref 13.0–17.0)
Immature Granulocytes: 0 %
Lymphocytes Relative: 32 %
Lymphs Abs: 3 10*3/uL (ref 0.7–4.0)
MCH: 29.6 pg (ref 26.0–34.0)
MCHC: 34 g/dL (ref 30.0–36.0)
MCV: 87.3 fL (ref 80.0–100.0)
Monocytes Absolute: 0.8 10*3/uL (ref 0.1–1.0)
Monocytes Relative: 8 %
Neutro Abs: 4.8 10*3/uL (ref 1.7–7.7)
Neutrophils Relative %: 52 %
Platelet Count: 200 10*3/uL (ref 150–400)
RBC: 4.32 MIL/uL (ref 4.22–5.81)
RDW: 13.3 % (ref 11.5–15.5)
WBC Count: 9.4 10*3/uL (ref 4.0–10.5)
nRBC: 0 % (ref 0.0–0.2)

## 2022-10-07 MED ORDER — LEUPROLIDE ACETATE (6 MONTH) 45 MG ~~LOC~~ KIT
45.0000 mg | PACK | Freq: Once | SUBCUTANEOUS | Status: AC
  Administered 2022-10-07: 45 mg via SUBCUTANEOUS
  Filled 2022-10-07: qty 45

## 2022-10-07 NOTE — Progress Notes (Signed)
Hematology and Oncology Follow Up Visit  Cody [name] [id number] 1955/11/22 67 y.o. 10/07/2022 8:41 AM Pcp, NoNo ref. provider found   Principle Diagnosis: 67 year old man with prostate cancer diagnosed in 2010 with a PSA of 10 and a Gleason score 3+4 = 7.  He developed castration-resistant advanced disease with peritoneal involvement in February 2023.    Prior Therapy:  He is status post radical prostatectomy on April 26 and 2012.  He final pathological staging showed a Gleason score of 3+4 =7.    His PSA started to rise in 2012 but failed to reestablish care until 2016 with a PSA up to 21.82.  He was started on androgen deprivation therapy since that time and has been receiving it intermittently.    It was restarted in 2018 and stopped in 2019 and subsequently restarted in March 2020.  His PSA nadired to 0.15 in June 2020 but started to rise since that time.  His PSA on November 13, 2021 was 3.22 which is a rise from 1.0 in May 2022 and 2.15 from November 2022.  His testosterone level showed near castrate level at that time.   Staging scans including a PSMA PET scan in February 2023 showed peritoneal involvement predominantly in the pelvis.  Current therapy:   Androgen deprivation therapy under the care of Cody [name].  His next injection will be given today and repeated in 6 months.  Zytiga 1000 mg daily with prednisone started on January 09, 2022.  His dose was reduced to 500 mg in May 2023 due to side effects.  Interim History: Cody [name] presents today for a follow-up visit.  Since last visit, he reports no major changes in his health.  He denies any recent complications related to [facility].  He denies any nausea, vomiting or abdominal pain.  He denies any hospitalizations or illnesses.  His performance status quality of life remain excellent.  He denies any worsening edema, diarrhea or bone pain.     Medications: Reviewed without changes.    Allergies: No Known  Allergies   Physical Exam:  Blood pressure (!) 160/81, pulse 72, temperature 97.8 F (36.6 C), temperature source Temporal, resp. rate 17, height '5\' 10"'$  (1.778 m), weight 148 lb 3.2 oz (67.2 kg), SpO2 98 %.   ECOG: 1    General appearance: Alert, awake without any distress. Head: Atraumatic without abnormalities Oropharynx: Without any thrush or ulcers. Eyes: No scleral icterus. Lymph nodes: No lymphadenopathy noted in the cervical, supraclavicular, or axillary nodes Heart:regular rate and rhythm, without any murmurs or gallops.   Lung: Clear to auscultation without any rhonchi, wheezes or dullness to percussion. Abdomin: Soft, nontender without any shifting dullness or ascites. Musculoskeletal: No clubbing or cyanosis. Neurological: No motor or sensory deficits. Skin: No rashes or lesions.       Lab Results: Lab Results  Component Value Date   WBC 9.4 10/07/2022   HGB 12.8 (L) 10/07/2022   HCT 37.7 (L) 10/07/2022   MCV 87.3 10/07/2022   PLT 200 10/07/2022     Chemistry      Component Value Date/Time   NA 141 06/10/2022 1501   NA 143 06/05/2022 0837   K 2.9 (L) 06/10/2022 1501   CL 105 06/10/2022 1501   CO2 33 (H) 06/10/2022 1501   BUN 17 06/10/2022 1501   BUN 15 06/05/2022 0837   CREATININE 0.92 06/10/2022 1501      Component Value Date/Time   CALCIUM 9.3 06/10/2022 1501   ALKPHOS 101 06/10/2022 1501  AST 52 (H) 06/10/2022 1501   ALT 79 (H) 06/10/2022 1501   BILITOT 0.4 06/10/2022 1501       Latest Reference Range & Units 02/19/22 14:59 04/03/22 15:09 06/10/22 15:01  Prostate Specific Ag, Serum 0.0 - 4.0 ng/mL 0.2 <0.1 0.1    Impression and Plan:  67 year old with:  1.  Castration-resistant advanced prostate cancer with peritoneal involvement diagnosed with peritoneal involvement in 2023.  The natural course of his disease was reviewed and treatment options were discussed.  He had an excellent PSA response to close to undetectable level from  3.22.  Risks and benefits of continuing this treatment were discussed at this time.  Different salvage therapy options including Taxotere chemotherapy, PARP inhibitor among others would be considered if he has progression of disease.  If his PSA starts to rise I will update his scans and potentially use Taxotere chemotherapy as a second line.    2.  Androgen deprivation therapy: Risks and benefits of receiving Eligard today were discussed.  He is agreeable to proceed today and will be repeated in 6 months.  3.  Hypertension: Related to Zytiga although it is unclear whether his blood pressure is elevated between visits.  I recommended ambulatory blood pressure monitoring and measuring his blood pressure between visits.  I also asked him to establish care with a primary care physician to assist in overall health maintenance as well as management of his blood pressure.  4.  Electrolyte imbalance and abnormalities liver function test: His potassium and LFTs are normal at this time.  We will continue to monitor in the future on Zytiga.    5.  Follow-up: In 3 months for a follow-up visit.   30  minutes were spent on this encounter.  The time was dedicated to reviewing his disease status, treatment choices and outlining future plan of care discussion.        Cody Button, MD 1/2/20248:41 AM

## 2022-10-07 NOTE — Patient Instructions (Signed)
Leuprolide Suspension for Injection (Prostate Cancer) What is this medication? LEUPROLIDE (loo PROE lide) reduces the symptoms of prostate cancer. It works by decreasing levels of the hormone testosterone in the body. This prevents prostate cancer cells from spreading or growing. This medicine may be used for other purposes; ask your health care provider or pharmacist if you have questions. COMMON BRAND NAME(S): Eligard, Lupron Depot, Lupron Depot-Ped, Lutrate Depot, Viadur What should I tell my care team before I take this medication? They need to know if you have any of these conditions: Diabetes Heart disease Heart failure High or low levels of electrolytes, such as magnesium, potassium, or sodium in your blood Irregular heartbeat or rhythm Seizures An unusual or allergic reaction to leuprolide, other medications, foods, dyes, or preservatives Pregnant or trying to get pregnant Breast-feeding How should I use this medication? This medication is injected under the skin or into a muscle. It is given by your care team in a hospital or clinic setting. Talk to your care team about the use of this medication in children. Special care may be needed. Overdosage: If you think you have taken too much of this medicine contact a poison control center or emergency room at once. NOTE: This medicine is only for you. Do not share this medicine with others. What if I miss a dose? Keep appointments for follow-up doses. It is important not to miss your dose. Call your care team if you are unable to keep an appointment. What may interact with this medication? Do not take this medication with any of the following: Cisapride Dronedarone Ketoconazole Levoketoconazole Pimozide Thioridazine This medication may also interact with the following: Other medications that cause heart rhythm changes This list may not describe all possible interactions. Give your health care provider a list of all the medicines,  herbs, non-prescription drugs, or dietary supplements you use. Also tell them if you smoke, drink alcohol, or use illegal drugs. Some items may interact with your medicine. What should I watch for while using this medication? Visit your care team for regular checks on your progress. Tell your care team if your symptoms do not start to get better or if they get worse. This medication may increase blood sugar. The risk may be higher in patients who already have diabetes. Ask your care team what you can do to lower the risk of diabetes while taking this medication. This medication may cause infertility. Talk to your care team if you are concerned about your fertility. Heart attacks and strokes have been reported with the use of this medication. Get emergency help if you develop signs or symptoms of a heart attack or stroke. Talk to your care team about the risks and benefits of this medication. What side effects may I notice from receiving this medication? Side effects that you should report to your care team as soon as possible: Allergic reactions--skin rash, itching, hives, swelling of the face, lips, tongue, or throat Heart attack--pain or tightness in the chest, shoulders, arms, or jaw, nausea, shortness of breath, cold or clammy skin, feeling faint or lightheaded Heart rhythm changes--fast or irregular heartbeat, dizziness, feeling faint or lightheaded, chest pain, trouble breathing High blood sugar (hyperglycemia)--increased thirst or amount of urine, unusual weakness or fatigue, blurry vision Mood swings, irritability, hostility Seizures Stroke--sudden numbness or weakness of the face, arm, or leg, trouble speaking, confusion, trouble walking, loss of balance or coordination, dizziness, severe headache, change in vision Thoughts of suicide or self-harm, worsening mood, feelings of depression Side   effects that usually do not require medical attention (report to your care team if they continue or  are bothersome): Bone pain Change in sex drive or performance General discomfort and fatigue Hot flashes Muscle pain Pain, redness, or irritation at injection site Swelling of the ankles, hands, or feet This list may not describe all possible side effects. Call your doctor for medical advice about side effects. You may report side effects to FDA at 1-800-FDA-1088. Where should I keep my medication? This medication is given in a hospital or clinic. It will not be stored at home. NOTE: This sheet is a summary. It may not cover all possible information. If you have questions about this medicine, talk to your doctor, pharmacist, or health care provider.  2023 Elsevier/Gold Standard (2021-12-04 00:00:00)  

## 2022-10-08 LAB — PROSTATE-SPECIFIC AG, SERUM (LABCORP): Prostate Specific Ag, Serum: <0.1 ng/mL (ref 0.0–4.0)

## 2022-11-13 ENCOUNTER — Encounter (INDEPENDENT_AMBULATORY_CARE_PROVIDER_SITE_OTHER): Payer: Self-pay

## 2022-11-28 ENCOUNTER — Other Ambulatory Visit: Payer: Self-pay

## 2022-11-28 ENCOUNTER — Telehealth: Payer: Self-pay | Admitting: Hematology

## 2022-11-28 DIAGNOSIS — C61 Malignant neoplasm of prostate: Secondary | ICD-10-CM

## 2022-11-28 MED ORDER — ABIRATERONE ACETATE 250 MG PO TABS: ORAL_TABLET | ORAL | 0 refills | Status: DC

## 2022-11-28 NOTE — Telephone Encounter (Signed)
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 At this time, patient requested to just cancel appointment and will call back when he gets his insurance situated.

## 2022-11-30 ENCOUNTER — Other Ambulatory Visit: Payer: Self-pay | Admitting: Hematology

## 2022-12-20 ENCOUNTER — Other Ambulatory Visit: Payer: Self-pay

## 2023-01-05 ENCOUNTER — Other Ambulatory Visit: Payer: 59

## 2023-01-05 ENCOUNTER — Ambulatory Visit: Payer: 59 | Admitting: Hematology

## 2023-03-04 ENCOUNTER — Telehealth: Payer: Self-pay | Admitting: Hematology

## 2023-03-04 NOTE — Telephone Encounter (Signed)
Contacted patient to scheduled appointments. Left message with appointment details and a call back number if patient had any questions or could not accommodate the time we provided.   

## 2023-03-11 ENCOUNTER — Other Ambulatory Visit: Payer: Self-pay

## 2023-03-12 ENCOUNTER — Encounter: Payer: Self-pay | Admitting: Hematology

## 2023-03-19 ENCOUNTER — Encounter: Payer: Self-pay | Admitting: Hematology

## 2023-03-19 ENCOUNTER — Inpatient Hospital Stay: Payer: Medicare HMO | Attending: Hematology

## 2023-03-19 ENCOUNTER — Inpatient Hospital Stay: Payer: Medicare HMO | Admitting: Hematology

## 2023-03-19 VITALS — BP 128/66 | HR 62 | Temp 98.3°F | Resp 18 | Ht 70.0 in | Wt 149.5 lb

## 2023-03-19 DIAGNOSIS — C61 Malignant neoplasm of prostate: Secondary | ICD-10-CM

## 2023-03-19 DIAGNOSIS — Z79899 Other long term (current) drug therapy: Secondary | ICD-10-CM | POA: Diagnosis not present

## 2023-03-19 DIAGNOSIS — M79671 Pain in right foot: Secondary | ICD-10-CM | POA: Insufficient documentation

## 2023-03-19 DIAGNOSIS — M79672 Pain in left foot: Secondary | ICD-10-CM | POA: Diagnosis not present

## 2023-03-19 DIAGNOSIS — C786 Secondary malignant neoplasm of retroperitoneum and peritoneum: Secondary | ICD-10-CM | POA: Insufficient documentation

## 2023-03-19 LAB — CMP (CANCER CENTER ONLY)
ALT: 15 U/L (ref 0–44)
AST: 21 U/L (ref 15–41)
Albumin: 4 g/dL (ref 3.5–5.0)
Alkaline Phosphatase: 84 U/L (ref 38–126)
Anion gap: 6 (ref 5–15)
BUN: 25 mg/dL — ABNORMAL HIGH (ref 8–23)
CO2: 32 mmol/L (ref 22–32)
Calcium: 9.6 mg/dL (ref 8.9–10.3)
Chloride: 100 mmol/L (ref 98–111)
Creatinine: 0.95 mg/dL (ref 0.61–1.24)
GFR, Estimated: >60 mL/min (ref >60)
Glucose, Bld: 92 mg/dL (ref 70–99)
Potassium: 3.8 mmol/L (ref 3.5–5.1)
Sodium: 138 mmol/L (ref 135–145)
Total Bilirubin: 0.4 mg/dL (ref 0.3–1.2)
Total Protein: 7.2 g/dL (ref 6.5–8.1)

## 2023-03-19 LAB — CBC WITH DIFFERENTIAL (CANCER CENTER ONLY)
Abs Immature Granulocytes: 0.02 10*3/uL (ref 0.00–0.07)
Basophils Absolute: 0.1 10*3/uL (ref 0.0–0.1)
Basophils Relative: 1 %
Eosinophils Absolute: 0.5 10*3/uL (ref 0.0–0.5)
Eosinophils Relative: 4 %
HCT: 35.8 % — ABNORMAL LOW (ref 39.0–52.0)
Hemoglobin: 12.1 g/dL — ABNORMAL LOW (ref 13.0–17.0)
Immature Granulocytes: 0 %
Lymphocytes Relative: 26 %
Lymphs Abs: 2.8 10*3/uL (ref 0.7–4.0)
MCH: 29.3 pg (ref 26.0–34.0)
MCHC: 33.8 g/dL (ref 30.0–36.0)
MCV: 86.7 fL (ref 80.0–100.0)
Monocytes Absolute: 1 10*3/uL (ref 0.1–1.0)
Monocytes Relative: 9 %
Neutro Abs: 6.6 10*3/uL (ref 1.7–7.7)
Neutrophils Relative %: 60 %
Platelet Count: 232 10*3/uL (ref 150–400)
RBC: 4.13 MIL/uL — ABNORMAL LOW (ref 4.22–5.81)
RDW: 13.2 % (ref 11.5–15.5)
WBC Count: 10.8 10*3/uL — ABNORMAL HIGH (ref 4.0–10.5)
nRBC: 0 % (ref 0.0–0.2)

## 2023-03-19 MED ORDER — ABIRATERONE ACETATE 250 MG PO TABS
ORAL_TABLET | ORAL | 5 refills | Status: DC
Start: 2023-03-19 — End: 2023-03-31

## 2023-03-19 NOTE — Assessment & Plan Note (Signed)
-  Stage IV with peritoneal metastasis, castration resistant -Initial prostate cancer diagnosed in 2010 with a PSA of 10 and Gleason score 3+4=7, developed peritoneal metastasis in February 2023 -He received intermittent androgen deprivation therapy since 2016.  PSA nadired to 0.15 in June 2022, and started to rise since then. -PSMA PET scan in February 2023 showed peritoneal metastasis -He is on Eligard injection every 6 months under Dr. Laverle Patter -Zytiga 1000 mg daily with prednisone started on January 09, 2022.  His dose was reduced to 500 mg in May 2023 due to side effects.

## 2023-03-19 NOTE — Progress Notes (Signed)
[facility]   [phone] Fax:([phone]   Clinic Follow up Note   Patient Care Team: Pcp, No as PCP - General Malachy Mood, MD as Attending Physician (Hematology and Oncology)  Date of Service:  03/19/2023  CHIEF COMPLAINT: f/u of Prostate Cancer  CURRENT THERAPY:  Androgen deprivation therapy under the care of Dr. Laverle Patter.  His next injection will be given today and repeated in 6 months.   Zytiga 1000 mg daily with prednisone started on January 09, 2022.  His dose was reduced to 500 mg in May 2023 due to side effects.    ASSESSMENT:  Cody [name] is a 67 y.o. 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  PSA nadired to 0.15 in June 2022, and started to rise since then. -PSMA PET scan in February 2023 showed peritoneal metastasis -He is on Eligard injection every 6 months under Dr. Laverle Patter -Zytiga 1000 mg daily with prednisone started on January 09, 2022.  His dose was reduced to 500 mg in May 2023 due to side effects.  -he run out of zytiga in March 2024 and did not f/u with [place] due to insurance issue -He is clinically doing well overall, complains new onset bilateral feet pain, still able to walk without difficulty.  He plans to talk to his primary care physician about that.  He has no other new pain or discomfort -Lab reviewed, CBC and CMP are unremarkable, PSA still pending -I refilled Zytiga 500 mg daily, he is due for next Eligard injection in 3 weeks, will schedule -Lab and follow-up in 3 weeks    PLAN: -lab reviewed -CMP- normal -PSA- pending - I refill Zytiga  -Eligard injection in 3 weeks -lab and f/u in 3 weeks   SUMMARY OF ONCOLOGIC HISTORY: Oncology History Overview Note   Cancer Staging   Prostate cancer (HCC) Staging form: Prostate, AJCC 8th Edition - Clinical: Stage IVB (cTX, cNX, pM1, PSA: 10, Grade Group: 3) - Signed by Benjiman Core, MD on 01/03/2022 Prostate specific antigen (PSA) range: 10 to 19 Gleason score: 7 Histologic grading system: 5 grade system     Prostate cancer (HCC)  01/03/2022 Initial Diagnosis   Prostate cancer (HCC)   01/03/2022 Cancer Staging   Staging form: Prostate, AJCC 8th Edition - Clinical: Stage IVB (cTX, cNX, pM1, PSA: 10, Grade Group: 3) - Signed by Benjiman Core, MD on 01/03/2022 Prostate specific antigen (PSA) range: 10 to 19 Gleason score: 7 Histologic grading system: 5 grade system      INTERVAL HISTORY:  Cody [name] is here for a follow up of Prostate Cancer. He was last seen by Dr. Clelia Croft on 10/07/2022. He presents to the clinic alone. Pt state that he is having pain on the bottom of his feet. Pt denies having any other issues. Pt state that is appetite is good. Pt state that he has been taking two tablets of the Zytiga due to severe side effects. Pt has been of the Zytiga since March      All other systems were reviewed with the patient and are negative.  MEDICAL HISTORY:  Past Medical History:  Diagnosis Date   Cancer [facility])    Prostate    SURGICAL HISTORY: Past Surgical History:  Procedure Laterality Date  PROSTATE SURGERY     TONSILLECTOMY      I have reviewed the social history and family history with the patient and they are unchanged from previous note.  ALLERGIES:  has No Known Allergies.  MEDICATIONS:  Current Outpatient Medications  Medication Sig Dispense Refill   abiraterone acetate (ZYTIGA) 250 MG tablet TAKE 2 TABLETS (1,000MG ) BY  MOUTH ONCE DAILY ON AN EMPTY  STOMACH 1 HOUR BEFORE OR 2 HOURS AFTER A MEAL 60 tablet 5   Na Sulfate-K Sulfate-Mg Sulf 17.5-3.13-1.6 GM/177ML SOLN Take by mouth as directed.     potassium chloride SA (KLOR-CON M) 20 MEQ tablet Take 1 tablet (20 mEq total) by  mouth daily. 30 tablet 0   No current facility-administered medications for this visit.    PHYSICAL EXAMINATION: ECOG PERFORMANCE STATUS: 1 - Symptomatic but completely ambulatory  Vitals:   03/19/23 1325  BP: 128/66  Pulse: 62  Resp: 18  Temp: 98.3 F (36.8 C)  SpO2: 100%   Wt Readings from Last 3 Encounters:  03/19/23 149 lb 8 oz (67.8 kg)  10/07/22 148 lb 3.2 oz (67.2 kg)  06/10/22 140 lb 4.8 oz (63.6 kg)     GENERAL:alert, no distress and comfortable SKIN: skin color normal, no rashes or significant lesions EYES: normal, Conjunctiva are pink and non-injected, sclera clear  NEURO: alert & oriented x 3 with fluent speech ABDOMEN:(-)abdomen soft, (-)non-tender and normal bowel sounds   LABORATORY DATA:  I have reviewed the data as listed    Latest Ref Rng & Units 03/19/2023   12:37 PM 10/07/2022    8:36 AM 06/10/2022    3:01 PM  CBC  WBC 4.0 - 10.5 K/uL 10.8  9.4  8.1   Hemoglobin 13.0 - 17.0 g/dL 16.1  09.6  04.5   Hematocrit 39.0 - 52.0 % 35.8  37.7  35.3   Platelets 150 - 400 K/uL 232  200  197         Latest Ref Rng & Units 03/19/2023   12:37 PM 10/07/2022    8:36 AM 06/10/2022    3:01 PM  CMP  Glucose 70 - 99 mg/dL 92  92  96   BUN 8 - 23 mg/dL 25  19  17    Creatinine 0.61 - 1.24 mg/dL 4.09  8.11  9.14   Sodium 135 - 145 mmol/L 138  140  141   Potassium 3.5 - 5.1 mmol/L 3.8  3.9  2.9   Chloride 98 - 111 mmol/L 100  103  105   CO2 22 - 32 mmol/L 32  33  33   Calcium 8.9 - 10.3 mg/dL 9.6  9.4  9.3   Total Protein 6.5 - 8.1 g/dL 7.2  6.4  6.3   Total Bilirubin 0.3 - 1.2 mg/dL 0.4  0.4  0.4   Alkaline Phos 38 - 126 U/L 84  86  101   AST 15 - 41 U/L 21  30  52   ALT 0 - 44 U/L 15  40  79       RADIOGRAPHIC STUDIES: I have personally reviewed the radiological images as listed and agreed with the findings in the report. No results found.    No orders of the defined types were placed in this encounter.  All questions were answered. The patient knows to  call the clinic with any problems, questions or concerns. No barriers to learning was detected. The total time spent in the appointment was 30 minutes.  Malachy Mood, MD 03/19/2023   Carolin Coy, CMA, am acting as scribe for Malachy Mood, MD.   I have reviewed the above documentation for accuracy and completeness, and I agree with the above.

## 2023-03-21 LAB — PROSTATE-SPECIFIC AG, SERUM (LABCORP): Prostate Specific Ag, Serum: 0.2 ng/mL (ref 0.0–4.0)

## 2023-03-26 ENCOUNTER — Other Ambulatory Visit: Payer: Self-pay

## 2023-03-30 ENCOUNTER — Telehealth: Payer: Self-pay

## 2023-03-30 NOTE — Telephone Encounter (Signed)
Open in ERROR

## 2023-03-31 ENCOUNTER — Other Ambulatory Visit: Payer: Self-pay

## 2023-03-31 DIAGNOSIS — C61 Malignant neoplasm of prostate: Secondary | ICD-10-CM

## 2023-03-31 MED ORDER — ABIRATERONE ACETATE 250 MG PO TABS
500.0000 mg | ORAL_TABLET | Freq: Every day | ORAL | 5 refills | Status: DC
Start: 2023-03-31 — End: 2023-09-10

## 2023-05-12 ENCOUNTER — Encounter: Payer: Self-pay | Admitting: Hematology

## 2023-05-13 ENCOUNTER — Ambulatory Visit: Payer: Medicare HMO | Admitting: Podiatry

## 2023-05-13 ENCOUNTER — Encounter: Payer: Self-pay | Admitting: Podiatry

## 2023-05-13 DIAGNOSIS — D2371 Other benign neoplasm of skin of right lower limb, including hip: Secondary | ICD-10-CM

## 2023-05-13 DIAGNOSIS — D2372 Other benign neoplasm of skin of left lower limb, including hip: Secondary | ICD-10-CM | POA: Diagnosis not present

## 2023-05-13 NOTE — Progress Notes (Signed)
  Subjective:  Patient ID: Cody [name], male    DOB: Oct 01, 1956,   MRN: [id number]  No chief complaint on file.   67 y.o. male presents for concern of pain in both of his feet. Relates lesions on both of his feet that he has dealt with for years. Has tried to take care of himself but they return and have been very painful especially on his left fourth toe. Denies any other pedal complaints. Denies n/v/f/c.   Past Medical History:  Diagnosis Date   Cancer [facility])    Prostate    Objective:  Physical Exam: Vascular: DP/PT pulses 2/4 bilateral. CFT <3 seconds. Normal hair growth on digits. No edema.  Skin. No lacerations or abrasions bilateral feet. Hyperkeratotic cored lesions noted to the plantar lateral foot bilateral and bilateral heels as well as lateral fifth metatarsal head on right and plantar fourth digit on left. Disruption of skin lines noted.  Musculoskeletal: MMT 5/5 bilateral lower extremities in DF, PF, Inversion and Eversion. Deceased ROM in DF of ankle joint.  Neurological: Sensation intact to light touch.   Assessment:   1. Benign neoplasm of skin of right foot   2. Benign neoplasm of skin of left foot      Plan:  Patient was evaluated and treated and all questions answered. -Discussed benign skin lesions.  with patient and treatment options.  -Hyperkeratotic tissue was debrided with chisel without incident.  -Applied salycylic acid treatment to area with dressing. Advised to remove bandaging tomorrow.  -Encouraged daily moisturizing -Discussed use of pumice stone -Advised good supportive shoes and inserts -Patient to return to office as needed or sooner if condition worsens.   Louann Sjogren, DPM

## 2023-05-20 ENCOUNTER — Encounter: Payer: Self-pay | Admitting: Hematology

## 2023-06-06 ENCOUNTER — Encounter (HOSPITAL_COMMUNITY): Payer: Self-pay

## 2023-06-06 ENCOUNTER — Ambulatory Visit (HOSPITAL_COMMUNITY)
Admission: EM | Admit: 2023-06-06 | Discharge: 2023-06-06 | Disposition: A | Discharge status: Home / Self Care | Payer: Medicare HMO

## 2023-06-06 DIAGNOSIS — R0781 Pleurodynia: Secondary | ICD-10-CM

## 2023-06-06 DIAGNOSIS — S29011A Strain of muscle and tendon of front wall of thorax, initial encounter: Secondary | ICD-10-CM

## 2023-06-06 LAB — POCT URINALYSIS DIP (MANUAL ENTRY)
Bilirubin, UA: NEGATIVE
Blood, UA: NEGATIVE
Glucose, UA: NEGATIVE mg/dL
Ketones, POC UA: NEGATIVE mg/dL
Leukocytes, UA: NEGATIVE
Nitrite, UA: NEGATIVE
Protein Ur, POC: NEGATIVE mg/dL
Spec Grav, UA: 1.025 (ref 1.010–1.025)
Urobilinogen, UA: 0.2 E.U./dL
pH, UA: 7 (ref 5.0–8.0)

## 2023-06-06 MED ORDER — IBUPROFEN 600 MG PO TABS: 600.0000 mg | ORAL_TABLET | Freq: Three times a day (TID) | ORAL | 0 refills | Status: DC | PRN

## 2023-06-06 NOTE — ED Provider Notes (Signed)
[facility]    CSN: [id number] Arrival date & time: 06/06/23  1114      History   Chief Complaint Chief Complaint  Patient presents with   Flank Pain    HPI Cody [name] is a 67 y.o. male.  Patient reports pain when reaching overhead that is lateral rib pain that radiates to the back.  Pain lasted about 5 to 10 minutes then dissipates.  The history is provided by the patient.  Flank Pain This is a new problem. The current episode started yesterday. Episode frequency: Intermittently with reaching. The problem has not changed since onset.Exacerbated by: Reaching overhead. The symptoms are relieved by relaxation. He has tried rest for the symptoms. The treatment provided significant relief.    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 Authorizing Provider  ibuprofen (ADVIL) 600 MG tablet Take 1 tablet (600 mg total) by mouth every 8 (eight) hours as needed for mild pain. 06/06/23  Yes Raciel Caffrey, Linde Gillis, NP  losartan-hydrochlorothiazide (HYZAAR) 100-25 MG tablet Take 1 tablet by mouth every morning. 05/11/23  Yes [provider]  abiraterone acetate (ZYTIGA) 250 MG tablet Take 2 tablets (500 mg total) by mouth daily. TAKE 2 TABLETS (500MG ) BY  MOUTH ONCE DAILY ON AN EMPTY STOMACH 1 HOUR BEFORE OR 2 HOURS AFTER A MEAL 03/31/23   Malachy Mood, MD  Na Sulfate-K Sulfate-Mg Sulf 17.5-3.13-1.6 GM/177ML SOLN  Take by mouth as directed. 04/24/22   [provider]  potassium chloride SA (KLOR-CON M) 20 MEQ tablet Take 1 tablet (20 mEq total) by mouth daily. 06/10/22   Benjiman Core, MD    Family History History reviewed. No pertinent family history.  Social History Social History   Tobacco Use   Smoking status: Never   Smokeless tobacco: Never  Vaping Use   Vaping status: Never Used  Substance Use Topics   Alcohol use: Not Currently   Drug use: Yes    Types: Marijuana     Allergies   Patient has no known allergies.   Review of Systems Review of Systems  Genitourinary:  Positive for flank pain.  Musculoskeletal:        Rib cage pain lateral left  All other systems reviewed and are negative.    Physical Exam Triage Vital Signs ED Triage Vitals [06/06/23 1149]  Encounter Vitals Group     BP 126/75     Systolic BP Percentile      Diastolic BP Percentile      Pulse Rate 66     Resp 16     Temp 98.1 F (36.7 C)     Temp Source Oral     SpO2 98 %     Weight 140 lb (63.5 kg)     Height 5\' 10"  (1.778 m)     Head Circumference  Peak Flow      Pain Score 6     Pain Loc      Pain Education      Exclude from Growth Chart    No data found.  Updated Vital Signs BP 126/75 (BP Location: Left Arm)   Pulse 66   Temp 98.1 F (36.7 C) (Oral)   Resp 16   Ht 5\' 10"  (1.778 m)   Wt 140 lb (63.5 kg)   SpO2 98%   BMI 20.09 kg/m   Visual Acuity Right Eye Distance:   Left Eye Distance:   Bilateral Distance:    Right Eye Near:   Left Eye Near:    Bilateral Near:     Physical Exam Vitals and nursing note reviewed.  Constitutional:      Appearance: Normal appearance.  Cardiovascular:     Rate and Rhythm: Normal rate and regular rhythm.  Pulmonary:     Effort: Pulmonary effort is normal.     Breath sounds: Normal breath sounds.  Abdominal:     Tenderness: There is left CVA tenderness.  Musculoskeletal:     Cervical back: Normal.     Thoracic back:  Tenderness present.       Back:     Comments: Left lateral tenderness of the rib cage radiating to posterior  Neurological:     Mental Status: He is alert.      UC Treatments / Results  Labs (all labs ordered are listed, but only abnormal results are displayed) Labs Reviewed  POCT URINALYSIS DIP (MANUAL ENTRY)    EKG   Radiology No results found.  Procedures Procedures (including critical care time)  Medications Ordered in UC Medications - No data to display  Initial Impression / Assessment and Plan / UC Course  I have reviewed the triage vital signs and the nursing notes.  Pertinent labs & imaging results that were available during my care of the patient were reviewed by me and considered in my medical decision making (see chart for details).  Clinical Course as of 06/06/23 1237  Sat Jun 06, 2023  1234 POC urinalysis dipstick [DB]    Clinical Course User Index [DB] Nelda Marseille, NP  Patient is reporting left lateral rib pain that radiates to the back however is also reporting CVA tenderness.  This tenderness may be kidney pain versus muscular pain. We will run a UA to rule out UTI.  UA dipstick is negative.  CVA tenderness and left lateral rib pain radiating to the back most likely muscle strain. Final Clinical Impressions(s) / UC Diagnoses   Final diagnoses:  Rib pain on left side  Muscle strain of chest wall, initial encounter     Discharge Instructions      Use ibuprofen for inflammation to the left rib cage. Consider using ice 20 minutes on and off followed by heat 20 minutes off-and-on. Gentle stretching over the next couple of days and rest.     ED Prescriptions     Medication Sig Dispense Auth. Provider   ibuprofen (ADVIL) 600 MG tablet Take 1 tablet (600 mg total) by mouth every 8 (eight) hours as needed for mild pain. 30 tablet Jameir Ake, Linde Gillis, NP      PDMP not reviewed this encounter.   Nelda Marseille, NP 06/06/23 [phone]

## 2023-06-06 NOTE — ED Triage Notes (Signed)
Patient here today with c/o left side flank pain that started yesterday. Patient states that is feels like muscle spasms. He states that this morning while he was in the shower, he reached up to rinse himself off and felt sharp pains in his side. He tried taking Tylenol with no relief.

## 2023-06-06 NOTE — Discharge Instructions (Addendum)
Use ibuprofen for inflammation to the left rib cage. Consider using ice 20 minutes on and off followed by heat 20 minutes off-and-on. Gentle stretching over the next couple of days and rest.

## 2023-06-24 ENCOUNTER — Inpatient Hospital Stay: Payer: Medicare HMO | Attending: Hematology

## 2023-06-24 ENCOUNTER — Inpatient Hospital Stay: Payer: Medicare HMO

## 2023-06-24 ENCOUNTER — Encounter: Payer: Self-pay | Admitting: Hematology

## 2023-06-24 ENCOUNTER — Inpatient Hospital Stay (HOSPITAL_BASED_OUTPATIENT_CLINIC_OR_DEPARTMENT_OTHER): Payer: Medicare HMO | Admitting: Hematology

## 2023-06-24 VITALS — BP 156/81 | HR 56 | Temp 98.5°F | Resp 18 | Ht 70.0 in | Wt 146.9 lb

## 2023-06-24 DIAGNOSIS — C786 Secondary malignant neoplasm of retroperitoneum and peritoneum: Secondary | ICD-10-CM | POA: Insufficient documentation

## 2023-06-24 DIAGNOSIS — C61 Malignant neoplasm of prostate: Secondary | ICD-10-CM | POA: Insufficient documentation

## 2023-06-24 DIAGNOSIS — Z79899 Other long term (current) drug therapy: Secondary | ICD-10-CM | POA: Insufficient documentation

## 2023-06-24 LAB — CBC WITH DIFFERENTIAL (CANCER CENTER ONLY)
Abs Immature Granulocytes: 0.02 10*3/uL (ref 0.00–0.07)
Basophils Absolute: 0.1 10*3/uL (ref 0.0–0.1)
Basophils Relative: 1 %
Eosinophils Absolute: 0.2 10*3/uL (ref 0.0–0.5)
Eosinophils Relative: 3 %
HCT: 39.5 % (ref 39.0–52.0)
Hemoglobin: 13.3 g/dL (ref 13.0–17.0)
Immature Granulocytes: 0 %
Lymphocytes Relative: 27 %
Lymphs Abs: 1.8 10*3/uL (ref 0.7–4.0)
MCH: 29.6 pg (ref 26.0–34.0)
MCHC: 33.7 g/dL (ref 30.0–36.0)
MCV: 88 fL (ref 80.0–100.0)
Monocytes Absolute: 0.8 10*3/uL (ref 0.1–1.0)
Monocytes Relative: 13 %
Neutro Abs: 3.8 10*3/uL (ref 1.7–7.7)
Neutrophils Relative %: 56 %
Platelet Count: 304 10*3/uL (ref 150–400)
RBC: 4.49 MIL/uL (ref 4.22–5.81)
RDW: 12.7 % (ref 11.5–15.5)
WBC Count: 6.7 10*3/uL (ref 4.0–10.5)
nRBC: 0 % (ref 0.0–0.2)

## 2023-06-24 LAB — CMP (CANCER CENTER ONLY)
ALT: 26 U/L (ref 0–44)
AST: 31 U/L (ref 15–41)
Albumin: 4.3 g/dL (ref 3.5–5.0)
Alkaline Phosphatase: 103 U/L (ref 38–126)
Anion gap: 7 (ref 5–15)
BUN: 21 mg/dL (ref 8–23)
CO2: 33 mmol/L — ABNORMAL HIGH (ref 22–32)
Calcium: 9.7 mg/dL (ref 8.9–10.3)
Chloride: 99 mmol/L (ref 98–111)
Creatinine: 0.87 mg/dL (ref 0.61–1.24)
GFR, Estimated: >60 mL/min (ref >60)
Glucose, Bld: 115 mg/dL — ABNORMAL HIGH (ref 70–99)
Potassium: 3.5 mmol/L (ref 3.5–5.1)
Sodium: 139 mmol/L (ref 135–145)
Total Bilirubin: 0.5 mg/dL (ref 0.3–1.2)
Total Protein: 7.7 g/dL (ref 6.5–8.1)

## 2023-06-24 MED ORDER — LEUPROLIDE ACETATE (6 MONTH) 45 MG ~~LOC~~ KIT
45.0000 mg | PACK | Freq: Once | SUBCUTANEOUS | Status: AC
  Administered 2023-06-24: 45 mg via SUBCUTANEOUS
  Filled 2023-06-24: qty 45

## 2023-06-24 NOTE — Progress Notes (Signed)
[facility]   Telephone:([phone] Fax:(336) [phone]   Clinic Follow up Note   Patient Care Team: Pcp, No as PCP - General Malachy Mood, MD as Attending Physician (Hematology and Oncology)  Date of Service:  06/24/2023  CHIEF COMPLAINT: f/u of  Prostate Cancer   CURRENT THERAPY:   Androgen deprivation therapy under the care of Dr. Mosetta Putt at [facility] 1000 mg daily with prednisone started on January 09, 2022.  His dose was reduced to 500 mg in May 2023 due to side effects.    ASSESSMENT:  Cody [name] is a 67 y.o. 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  PSA nadired to 0.15 in June 2022, and started to rise since then. -PSMA PET scan in February 2023 showed peritoneal metastasis -He is on Eligard injection every 6 months under Dr. Laverle Patter -Zytiga 1000 mg daily with prednisone started on January 09, 2022.  His dose was reduced to 500 mg in May 2023 due to side effects.  -He is clinically doing well, his PSA has been very low <1.0, will continue current therapy.   PLAN: -ELIGARD injection today (he is overdue), next injection in 6 months  -lab and f/u in 09/2023  SUMMARY OF ONCOLOGIC HISTORY: Oncology History Overview Note   Cancer Staging  Prostate cancer [facility]) Staging form: Prostate, AJCC 8th Edition - Clinical: Stage IVB (cTX, cNX, pM1, PSA: 10, Grade Group: 3) - Signed by Benjiman Core, MD on 01/03/2022 Prostate specific antigen (PSA) range: 10 to 19 Gleason score: 7 Histologic grading system: 5 grade system     Prostate cancer (HCC)  01/03/2022 Initial Diagnosis   Prostate cancer (HCC)   01/03/2022 Cancer Staging   Staging form: Prostate, AJCC 8th Edition - Clinical: Stage IVB (cTX, cNX, pM1, PSA:  10, Grade Group: 3) - Signed by Benjiman Core, MD on 01/03/2022 Prostate specific antigen (PSA) range: 10 to 19 Gleason score: 7 Histologic grading system: 5 grade system      INTERVAL HISTORY:  Cody [name] is here for a follow up of  Prostate Cancer. He was last seen by me on 03/19/2023. He presents to the clinic alone. Pt has no issue with Zytiga.     All other systems were reviewed with the patient and are negative.  MEDICAL HISTORY:  Past Medical History:  Diagnosis Date   Cancer [facility])    Prostate    SURGICAL HISTORY: Past Surgical History:  Procedure Laterality Date   PROSTATE SURGERY     TONSILLECTOMY      I have reviewed the social history and family history with the patient and they are unchanged from previous note.  ALLERGIES:  has No Known Allergies.  MEDICATIONS:  Current Outpatient Medications  Medication Sig Dispense Refill   abiraterone acetate (ZYTIGA) 250 MG tablet Take 2 tablets (500 mg total) by mouth daily. TAKE 2 TABLETS (500MG ) BY  MOUTH ONCE DAILY ON AN EMPTY STOMACH 1 HOUR BEFORE OR 2 HOURS AFTER A MEAL 60 tablet 5   ibuprofen (ADVIL) 600 MG tablet Take 1 tablet (600 mg total) by mouth every 8 (eight) hours as needed for mild pain. 30 tablet 0   losartan-hydrochlorothiazide (HYZAAR) 100-25 MG tablet Take 1 tablet by mouth every morning.  Na Sulfate-K Sulfate-Mg Sulf 17.5-3.13-1.6 GM/177ML SOLN Take by mouth as directed.     potassium chloride SA (KLOR-CON M) 20 MEQ tablet Take 1 tablet (20 mEq total) by mouth daily. 30 tablet 0   No current facility-administered medications for this visit.    PHYSICAL EXAMINATION: ECOG PERFORMANCE STATUS: 0 - Asymptomatic  Vitals:   06/24/23 1022  BP: (!) 156/81  Pulse: (!) 56  Resp: 18  Temp: 98.5 F (36.9 C)  SpO2: 100%   Wt Readings from Last 3 Encounters:  06/24/23 146 lb 14.4 oz (66.6 kg)  06/06/23 140 lb (63.5 kg)  03/19/23 149 lb 8 oz (67.8 kg)     GENERAL:alert, no distress and  comfortable SKIN: skin color normal, no rashes or significant lesions EYES: normal, Conjunctiva are pink and non-injected, sclera clear  NEURO: alert & oriented x 3 with fluent speech  LABORATORY DATA:  I have reviewed the data as listed    Latest Ref Rng & Units 06/24/2023    9:24 AM 03/19/2023   12:37 PM 10/07/2022    8:36 AM  CBC  WBC 4.0 - 10.5 K/uL 6.7  10.8  9.4   Hemoglobin 13.0 - 17.0 g/dL 16.1  09.6  04.5   Hematocrit 39.0 - 52.0 % 39.5  35.8  37.7   Platelets 150 - 400 K/uL 304  232  200         Latest Ref Rng & Units 06/24/2023    9:24 AM 03/19/2023   12:37 PM 10/07/2022    8:36 AM  CMP  Glucose 70 - 99 mg/dL 409  92  92   BUN 8 - 23 mg/dL 21  25  19    Creatinine 0.61 - 1.24 mg/dL 8.11  9.14  7.82   Sodium 135 - 145 mmol/L 139  138  140   Potassium 3.5 - 5.1 mmol/L 3.5  3.8  3.9   Chloride 98 - 111 mmol/L 99  100  103   CO2 22 - 32 mmol/L 33  32  33   Calcium 8.9 - 10.3 mg/dL 9.7  9.6  9.4   Total Protein 6.5 - 8.1 g/dL 7.7  7.2  6.4   Total Bilirubin 0.3 - 1.2 mg/dL 0.5  0.4  0.4   Alkaline Phos 38 - 126 U/L 103  84  86   AST 15 - 41 U/L 31  21  30    ALT 0 - 44 U/L 26  15  40       RADIOGRAPHIC STUDIES: I have personally reviewed the radiological images as listed and agreed with the findings in the report. No results found.    No orders of the defined types were placed in this encounter.  All questions were answered. The patient knows to call the clinic with any problems, questions or concerns. No barriers to learning was detected. The total time spent in the appointment was 25 minutes.     Malachy Mood, MD 06/24/2023   Carolin Coy, CMA, am acting as scribe for Malachy Mood, MD.   I have reviewed the above documentation for accuracy and completeness, and I agree with the above.

## 2023-06-24 NOTE — Assessment & Plan Note (Signed)
-  Stage IV with peritoneal metastasis, castration resistant -Initial prostate cancer diagnosed in 2010 with a PSA of 10 and Gleason score 3+4=7, developed peritoneal metastasis in February 2023 -He received intermittent androgen deprivation therapy since 2016.  PSA nadired to 0.15 in June 2022, and started to rise since then. -PSMA PET scan in February 2023 showed peritoneal metastasis -He is on Eligard injection every 6 months under Dr. Laverle Patter -Zytiga 1000 mg daily with prednisone started on January 09, 2022.  His dose was reduced to 500 mg in May 2023 due to side effects.  -He is clinically doing well, his PSA has been very low <1.0, will continue current therapy.

## 2023-06-24 NOTE — Patient Instructions (Signed)

## 2023-06-25 LAB — PROSTATE-SPECIFIC AG, SERUM (LABCORP): Prostate Specific Ag, Serum: <0.1 ng/mL (ref 0.0–4.0)

## 2023-07-10 ENCOUNTER — Encounter: Payer: Self-pay | Admitting: Hematology

## 2023-07-14 ENCOUNTER — Other Ambulatory Visit: Payer: Self-pay

## 2023-07-16 ENCOUNTER — Other Ambulatory Visit: Payer: Self-pay | Admitting: Medical Genetics

## 2023-07-16 DIAGNOSIS — Z006 Encounter for examination for normal comparison and control in clinical research program: Secondary | ICD-10-CM

## 2023-07-22 ENCOUNTER — Encounter: Payer: Self-pay | Admitting: Podiatry

## 2023-07-22 ENCOUNTER — Ambulatory Visit: Payer: Medicare HMO | Admitting: Podiatry

## 2023-07-22 DIAGNOSIS — D2371 Other benign neoplasm of skin of right lower limb, including hip: Secondary | ICD-10-CM | POA: Diagnosis not present

## 2023-07-22 DIAGNOSIS — D2372 Other benign neoplasm of skin of left lower limb, including hip: Secondary | ICD-10-CM

## 2023-07-22 NOTE — Progress Notes (Signed)
Subjective:  Patient ID: Cody [name], male    DOB: [birth year],   MRN: [id number]  Chief Complaint  Patient presents with   Nail Problem    RFC.    67 y.o. male presents for concern of pain in both of his feet. Relates lesions on both of his feet that he has dealt with for years. Has tried to take care of himself but they return and have been very painful especially on his left fourth toe. Denies any other pedal complaints. Denies n/v/f/c.   Past Medical History:  Diagnosis Date   Cancer [facility])    Prostate    Objective:  Physical Exam: Vascular: DP/PT pulses 2/4 bilateral. CFT <3 seconds. Normal hair growth on digits. No edema.  Skin. No lacerations or abrasions bilateral feet. Hyperkeratotic cored lesions noted to the plantar lateral foot bilateral and bilateral heels as well as lateral fifth metatarsal head on right and plantar fourth digit on left. Disruption of skin lines noted.  Musculoskeletal: MMT 5/5 bilateral lower extremities in DF, PF, Inversion and Eversion. Deceased ROM in DF of ankle joint.  Neurological: Sensation intact to light touch.   Assessment:   1. Benign neoplasm of skin of right foot   2. Benign neoplasm of skin of left foot       Plan:  Patient was evaluated and treated and all questions answered. -Discussed benign skin lesions.  with patient and treatment options.  -Hyperkeratotic tissue was debrided with chisel without incident.  -Applied salycylic acid treatment to area with dressing. Advised to remove bandaging tomorrow.  -Encouraged daily moisturizing -Discussed use of pumice stone -Advised good supportive shoes and inserts -Patient to return to office as needed or sooner if condition worsens.   Louann Sjogren, DPM

## 2023-09-05 ENCOUNTER — Telehealth: Payer: Self-pay | Admitting: Hematology

## 2023-09-09 ENCOUNTER — Ambulatory Visit: Payer: Medicare HMO

## 2023-09-10 ENCOUNTER — Other Ambulatory Visit: Payer: Self-pay | Admitting: Hematology

## 2023-09-10 DIAGNOSIS — C61 Malignant neoplasm of prostate: Secondary | ICD-10-CM

## 2023-09-20 NOTE — Assessment & Plan Note (Signed)
-  Stage IV with peritoneal metastasis, castration resistant -Initial prostate cancer diagnosed in 2010 with a PSA of 10 and Gleason score 3+4=7, developed peritoneal metastasis in February 2023 -He received intermittent androgen deprivation therapy since 2016.  PSA nadired to 0.15 in June 2022, and started to rise since then. -PSMA PET scan in February 2023 showed peritoneal metastasis -He is on Eligard injection every 6 months under Dr. Laverle Patter -Zytiga 1000 mg daily with prednisone started on January 09, 2022.  His dose was reduced to 500 mg in May 2023 due to side effects.  -He is clinically doing well, his PSA has been very low <1.0, will continue current therapy.

## 2023-09-21 ENCOUNTER — Ambulatory Visit: Payer: Medicare HMO | Admitting: Podiatry

## 2023-09-22 ENCOUNTER — Inpatient Hospital Stay: Payer: Medicare HMO | Attending: Hematology

## 2023-09-22 ENCOUNTER — Inpatient Hospital Stay: Payer: Medicare HMO | Admitting: Hematology

## 2023-09-22 VITALS — BP 136/73 | HR 66 | Temp 97.8°F | Resp 15 | Wt 145.0 lb

## 2023-09-22 DIAGNOSIS — C61 Malignant neoplasm of prostate: Secondary | ICD-10-CM

## 2023-09-22 DIAGNOSIS — Z79899 Other long term (current) drug therapy: Secondary | ICD-10-CM | POA: Insufficient documentation

## 2023-09-22 DIAGNOSIS — D649 Anemia, unspecified: Secondary | ICD-10-CM | POA: Diagnosis not present

## 2023-09-22 DIAGNOSIS — C786 Secondary malignant neoplasm of retroperitoneum and peritoneum: Secondary | ICD-10-CM | POA: Diagnosis present

## 2023-09-22 DIAGNOSIS — I10 Essential (primary) hypertension: Secondary | ICD-10-CM | POA: Insufficient documentation

## 2023-09-22 LAB — CMP (CANCER CENTER ONLY)
ALT: 22 U/L (ref 0–44)
AST: 28 U/L (ref 15–41)
Albumin: 4.2 g/dL (ref 3.5–5.0)
Alkaline Phosphatase: 84 U/L (ref 38–126)
Anion gap: 5 (ref 5–15)
BUN: 21 mg/dL (ref 8–23)
CO2: 35 mmol/L — ABNORMAL HIGH (ref 22–32)
Calcium: 9.7 mg/dL (ref 8.9–10.3)
Chloride: 98 mmol/L (ref 98–111)
Creatinine: 0.91 mg/dL (ref 0.61–1.24)
GFR, Estimated: >60 mL/min (ref >60)
Glucose, Bld: 128 mg/dL — ABNORMAL HIGH (ref 70–99)
Potassium: 3.5 mmol/L (ref 3.5–5.1)
Sodium: 138 mmol/L (ref 135–145)
Total Bilirubin: 0.7 mg/dL (ref <1.2)
Total Protein: 6.8 g/dL (ref 6.5–8.1)

## 2023-09-22 LAB — CBC WITH DIFFERENTIAL (CANCER CENTER ONLY)
Abs Immature Granulocytes: 0.03 10*3/uL (ref 0.00–0.07)
Basophils Absolute: 0.1 10*3/uL (ref 0.0–0.1)
Basophils Relative: 1 %
Eosinophils Absolute: 0.4 10*3/uL (ref 0.0–0.5)
Eosinophils Relative: 5 %
HCT: 35.4 % — ABNORMAL LOW (ref 39.0–52.0)
Hemoglobin: 12.1 g/dL — ABNORMAL LOW (ref 13.0–17.0)
Immature Granulocytes: 0 %
Lymphocytes Relative: 29 %
Lymphs Abs: 2.7 10*3/uL (ref 0.7–4.0)
MCH: 29.8 pg (ref 26.0–34.0)
MCHC: 34.2 g/dL (ref 30.0–36.0)
MCV: 87.2 fL (ref 80.0–100.0)
Monocytes Absolute: 0.8 10*3/uL (ref 0.1–1.0)
Monocytes Relative: 8 %
Neutro Abs: 5.4 10*3/uL (ref 1.7–7.7)
Neutrophils Relative %: 57 %
Platelet Count: 261 10*3/uL (ref 150–400)
RBC: 4.06 MIL/uL — ABNORMAL LOW (ref 4.22–5.81)
RDW: 12.8 % (ref 11.5–15.5)
WBC Count: 9.4 10*3/uL (ref 4.0–10.5)
nRBC: 0 % (ref 0.0–0.2)

## 2023-09-22 NOTE — Progress Notes (Signed)
[facility]   Telephone:(336) [phone] Fax:(336) [phone]   Clinic Follow up Note   Patient Care Team: Pcp, No as PCP - General Malachy Mood, MD as Attending Physician (Hematology and Oncology)  Date of Service:  09/22/2023  CHIEF COMPLAINT: f/u of prostate cancer  CURRENT THERAPY:  Eligard injection every 6 monthsZytiga 1000 mg daily with prednisone started on January 09, 2022.  His dose was reduced to 500 mg in May 2023 due to side effects.  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  PSA nadired to 0.15 in June 2022, and started to rise since then. -PSMA PET scan in February 2023 showed peritoneal metastasis -He is on Eligard injection every 6 months under Dr. Laverle Patter -Zytiga 1000 mg daily with prednisone started on January 09, 2022.  His dose was reduced to 500 mg in May 2023 due to side effects.  -He is clinically doing well, his PSA has been very low <1.0, will continue current therapy.   Assessment and Plan    Prostate Cancer with Peritoneal Metastasis A 67 year old with prostate cancer and peritoneal metastasis, well-controlled on Zytiga since April 2023 and hormonal injections since 2016. PSA levels were undetectable (<0.1) three months ago; current PSA results pending. Asymptomatic with normal energy levels and activity. Discussed the treatable but unlikely curable nature of the condition. Current therapy is the most tolerable option, with alternatives including chemotherapy, Pluvicto, and genetic testing for targeted therapy. Offered transferring his care to my partner Dr. Cherly Hensen who is specialized in GU oncology - Continue current hormonal therapy with Zytiga - Schedule follow-up appointment with Dr. Imogene Burn in three  months - Perform lab tests and administer hormonal injection at the next visit - Monitor PSA levels and follow up on results in a few days  Mild Anemia Hemoglobin level is 12.1, consistent with mild anemia, stable compared to the previous visit. Other blood counts are normal. - Monitor hemoglobin levels at the next visit  Hypertension Well-managed on current antihypertensive medication. - Continue current antihypertensive medication  General Health Maintenance Generally in good health with normal kidney and liver function. Active with no issues in urination or other symptoms. - Encourage continued physical activity and healthy lifestyle  Plan -Lab reviewed, he is currently doing very well -Continue Zytiga at the reduced dose 500 mg daily - Schedule follow-up appointment in three months -Eligard injection in 3 months -Will transfer his care to my partner Dr. Cherly Hensen, who will see him in 3 months      SUMMARY OF ONCOLOGIC HISTORY: Oncology History Overview Note   Cancer Staging  Prostate cancer [facility]) Staging form: Prostate, AJCC 8th Edition - Clinical: Stage IVB (cTX, cNX, pM1, PSA: 10, Grade Group: 3) - Signed by Benjiman Core, MD on 01/03/2022 Prostate specific antigen (PSA) range: 10 to 19 Gleason score: 7 Histologic grading system: 5 grade system     Prostate cancer (HCC)  01/03/2022 Initial Diagnosis   Prostate cancer (HCC)   01/03/2022 Cancer Staging   Staging form: Prostate, AJCC 8th Edition - Clinical: Stage IVB (cTX, cNX, pM1, PSA: 10, Grade Group: 3) - Signed by Benjiman Core, MD on 01/03/2022 Prostate specific antigen (PSA) range: 10 to 19 Gleason score: 7 Histologic grading system: 5 grade system      Discussed the  use of AI scribe software for clinical note transcription with the patient, who gave verbal consent to proceed.  History of Present Illness   A 67 year old patient with a history of prostate cancer with peritoneal metastasis presents for a  routine follow-up. The patient reports feeling well with no new symptoms or complaints. He denies any pain or discomfort and reports a good energy level. The patient maintains a normal diet and stays active. The patient has been on hormonal therapy for prostate cancer for approximately nine to ten years and has been on [place] since April 2023. The patient also takes medication for high blood pressure. The patient expresses interest in exploring other treatment options and is open to a second opinion.         All other systems were reviewed with the patient and are negative.  MEDICAL HISTORY:  Past Medical History:  Diagnosis Date   Cancer [facility])    Prostate    SURGICAL HISTORY: Past Surgical History:  Procedure Laterality Date   PROSTATE SURGERY     TONSILLECTOMY      I have reviewed the social history and family history with the patient and they are unchanged from previous note.  ALLERGIES:  has no known allergies.  MEDICATIONS:  Current Outpatient Medications  Medication Sig Dispense Refill   abiraterone acetate (ZYTIGA) 250 MG tablet TAKE 2 TABLETS BY MOUTH ONCE  DAILY ON AN EMPTY STOMACH 1 HOUR BEFORE OR 2 HOURS AFTER A MEAL 60 tablet 5   ibuprofen (ADVIL) 600 MG tablet Take 1 tablet (600 mg total) by mouth every 8 (eight) hours as needed for mild pain. 30 tablet 0   losartan-hydrochlorothiazide (HYZAAR) 100-25 MG tablet Take 1 tablet by mouth every morning.     No current facility-administered medications for this visit.    PHYSICAL EXAMINATION: ECOG PERFORMANCE STATUS: 0 - Asymptomatic  Vitals:   09/22/23 1002  BP: 136/73  Pulse: 66  Resp: 15  Temp: 97.8 F (36.6 C)  SpO2: 100%   Wt Readings from Last 3 Encounters:  09/22/23 145 lb (65.8 kg)  06/24/23 146 lb 14.4 oz (66.6 kg)  06/06/23 140 lb (63.5 kg)     GENERAL:alert, no distress and comfortable SKIN: skin color, texture, turgor are normal, no rashes or significant lesions EYES: normal, Conjunctiva are  pink and non-injected, sclera clear NECK: supple, thyroid normal size, non-tender, without nodularity LYMPH:  no palpable lymphadenopathy in the cervical, axillary  LUNGS: clear to auscultation and percussion with normal breathing effort HEART: regular rate & rhythm and no murmurs and no lower extremity edema ABDOMEN:abdomen soft, non-tender and normal bowel sounds Musculoskeletal:no cyanosis of digits and no clubbing  NEURO: alert & oriented x 3 with fluent speech, no focal motor/sensory deficits    LABORATORY DATA:  I have reviewed the data as listed    Latest Ref Rng & Units 09/22/2023    9:19 AM 06/24/2023    9:24 AM 03/19/2023   12:37 PM  CBC  WBC 4.0 - 10.5 K/uL 9.4  6.7  10.8   Hemoglobin 13.0 - 17.0 g/dL 78.2  95.6  21.3   Hematocrit 39.0 - 52.0 % 35.4  39.5  35.8   Platelets 150 - 400 K/uL 261  304  232         Latest Ref Rng & Units 09/22/2023    9:19 AM 06/24/2023    9:24 AM 03/19/2023   12:37 PM  CMP  Glucose 70 - 99 mg/dL 086  578  92   BUN 8 - 23 mg/dL 21  21  25    Creatinine 0.61 - 1.24 mg/dL 2.59  5.63  8.75   Sodium 135 - 145 mmol/L 138  139  138   Potassium 3.5 - 5.1 mmol/L 3.5  3.5  3.8   Chloride 98 - 111 mmol/L 98  99  100   CO2 22 - 32 mmol/L 35  33  32   Calcium 8.9 - 10.3 mg/dL 9.7  9.7  9.6   Total Protein 6.5 - 8.1 g/dL 6.8  7.7  7.2   Total Bilirubin <1.2 mg/dL 0.7  0.5  0.4   Alkaline Phos 38 - 126 U/L 84  103  84   AST 15 - 41 U/L 28  31  21    ALT 0 - 44 U/L 22  26  15        RADIOGRAPHIC STUDIES: I have personally reviewed the radiological images as listed and agreed with the findings in the report. No results found.    No orders of the defined types were placed in this encounter.  All questions were answered. The patient knows to call the clinic with any problems, questions or concerns. No barriers to learning was detected. The total time spent in the appointment was 25 minutes.     Malachy Mood, MD 09/22/2023

## 2023-09-23 LAB — PROSTATE-SPECIFIC AG, SERUM (LABCORP): Prostate Specific Ag, Serum: <0.1 ng/mL (ref 0.0–4.0)

## 2023-09-24 ENCOUNTER — Inpatient Hospital Stay: Payer: Medicare HMO

## 2023-09-24 ENCOUNTER — Inpatient Hospital Stay: Payer: Medicare HMO | Admitting: Hematology

## 2023-11-09 IMAGING — CT NM PET TUM IMG SKULL BASE T - THIGH
7 series · 25 of 25 positions shown · non-contrast
Comparison: None.
COMPARISON: None.

Addendum:
CLINICAL DATA: Prostate carcinoma.  Rising PSA.  PSA equal 3.22.

EXAM:
NUCLEAR MEDICINE PET SKULL BASE TO THIGH
TECHNIQUE: 9.5 mCi F18 Piflufolastat (Pylarify) was injected intravenously.
Full-ring PET imaging was performed from the skull base to thigh
after the radiotracer. CT data was obtained and used for attenuation
correction and anatomic localization.

[Series 3: pet sk_thigh ac · axial · 5.0mm · 4.07mm/px · z∈[-1406,-334]mm · 6 of 269 slices shown]
[im 1/269]
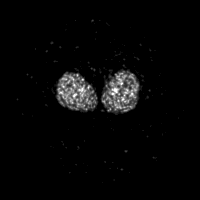
[im 54/269]
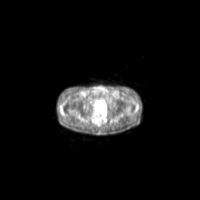
[im 108/269]
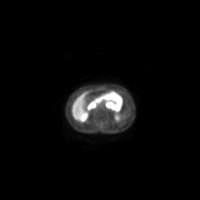
[im 161/269]
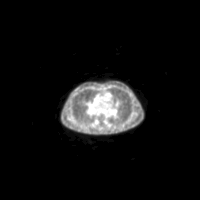
[im 215/269]
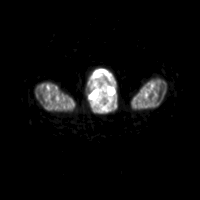
[im 269/269]
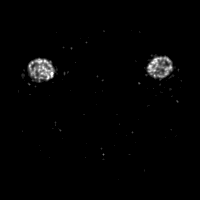

[Series 4: ct sk_thigh 5.0 bf37 · axial · 5.0mm · 0.98mm/px · z∈[-1406,-334]mm · 5 of 268 slices shown]
[im 1/268]
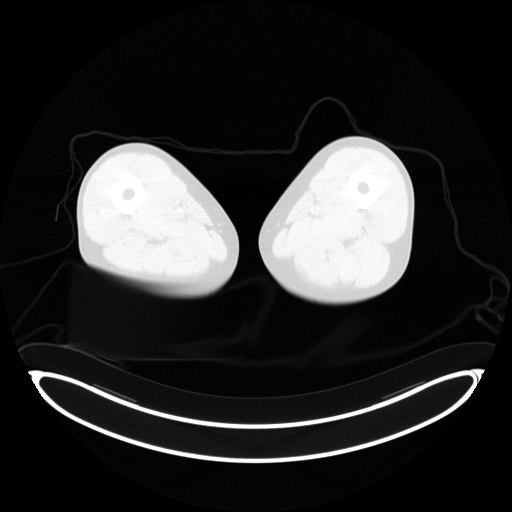
[im 67/268]
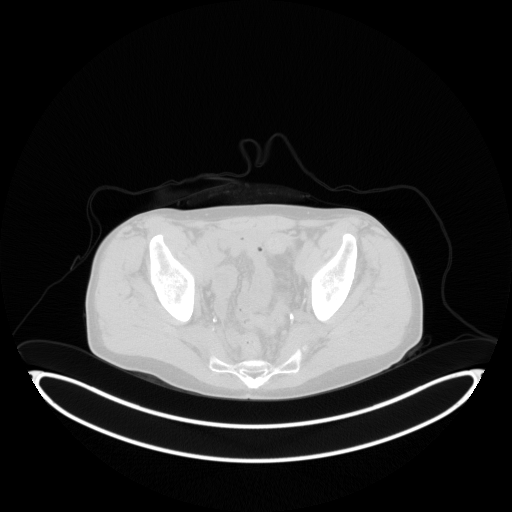
[im 134/268]
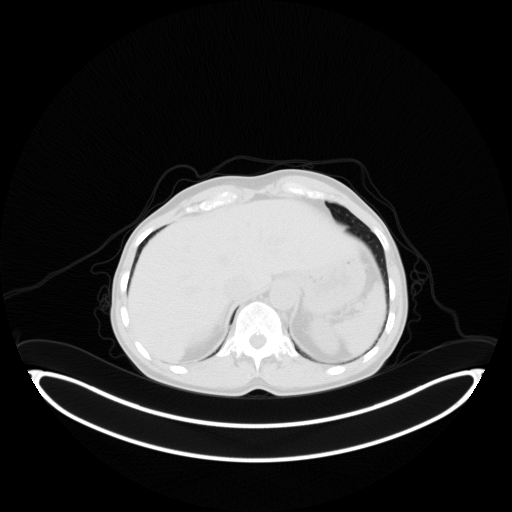
[im 201/268]
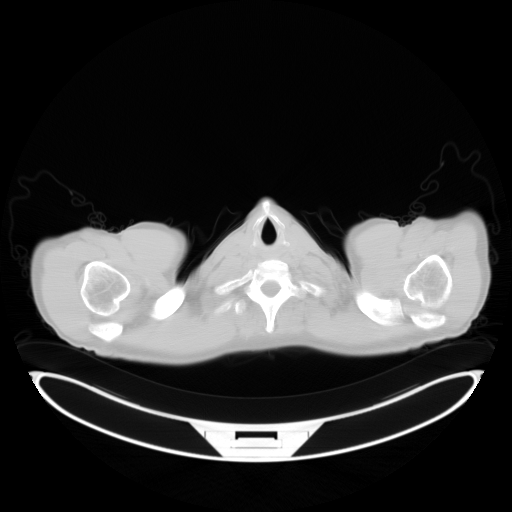
[im 268/268]
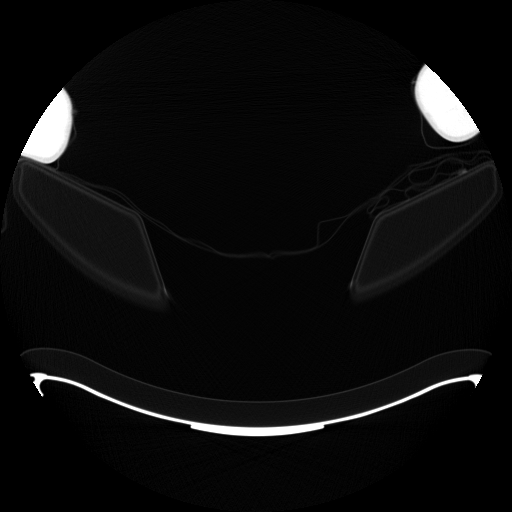

[Series 5: pet sk_thigh nac · axial · 5.0mm · 4.07mm/px · z∈[-1406,-334]mm · 5 of 269 slices shown]
[im 1/269]
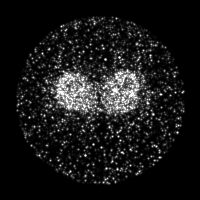
[im 68/269]
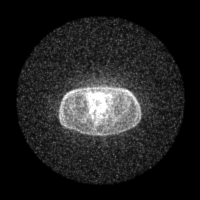
[im 135/269]
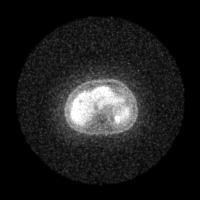
[im 202/269]
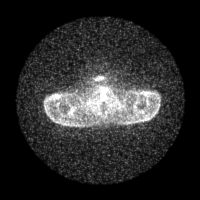
[im 269/269]
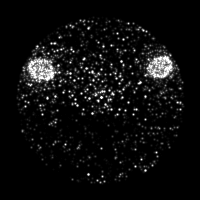

[Series 8: ct sk_thigh 5.0 br59 lung_bone · axial · 5.0mm · 0.80mm/px · z∈[-965,-589]mm · 2 of 95 slices shown]
[im 1/95]
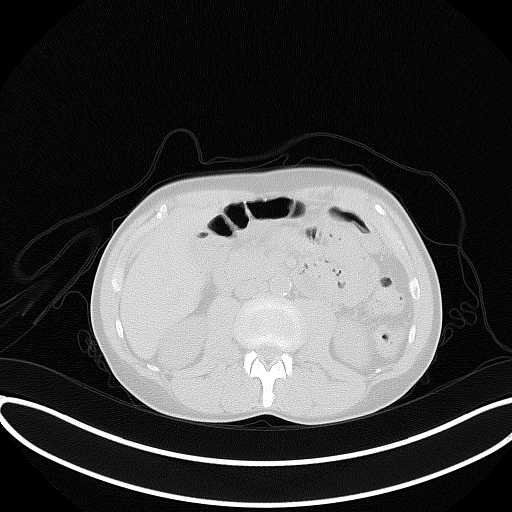
[im 95/95]
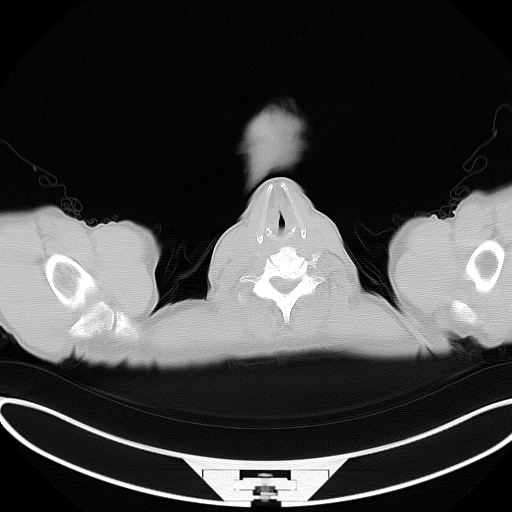

[Series 603: <mip collection> · coronal · 2.22mm/px · 1 of 32 slices shown]
[im 1/32]
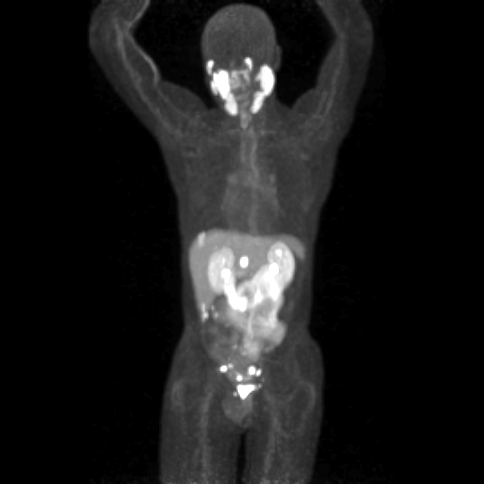

[Series 604: fused cor · 1 of 57 slices shown]
[im 1/57]
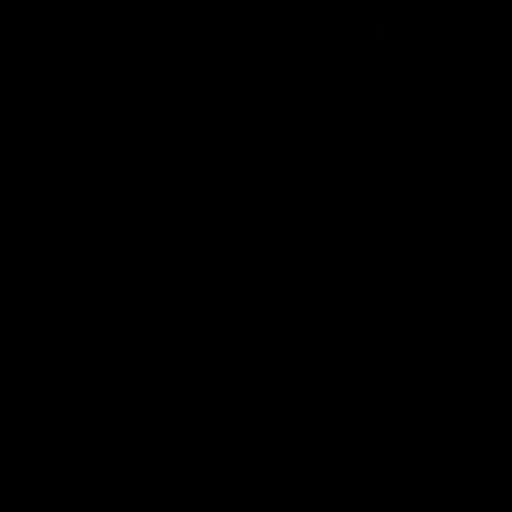

[Series 605: range-ct sk_thigh 5.0 bf37-tra-<alpha range> · 5 of 249 slices shown]
[im 1/249]
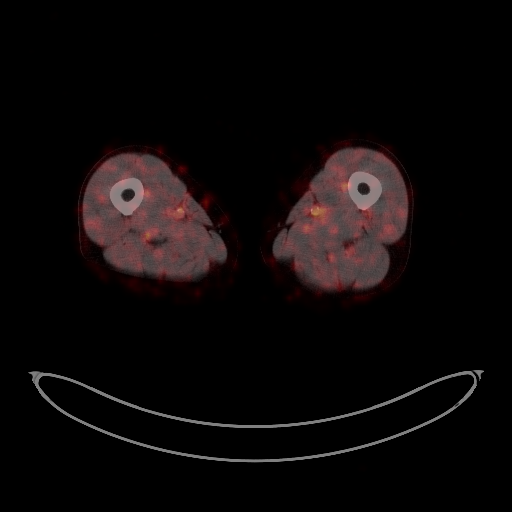
[im 63/249]
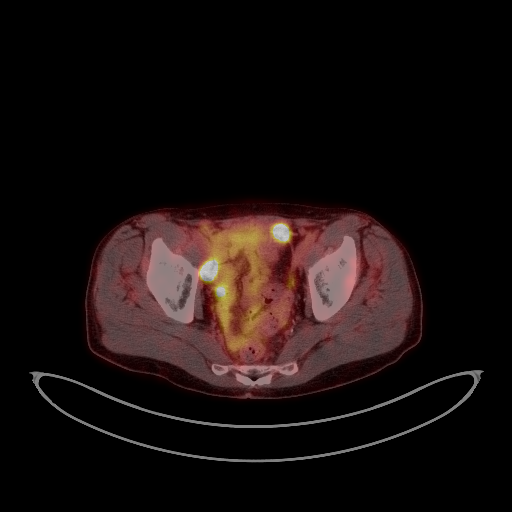
[im 125/249]
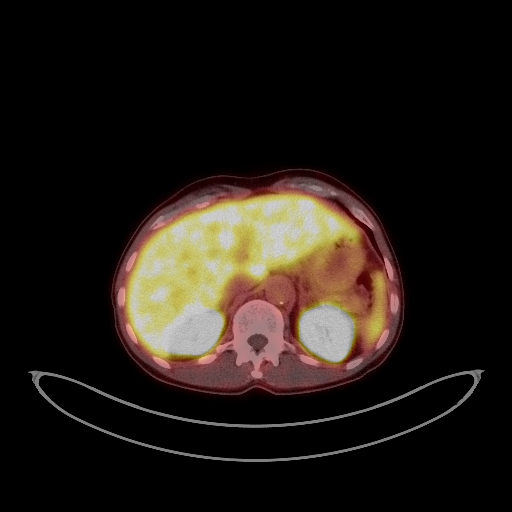
[im 187/249]
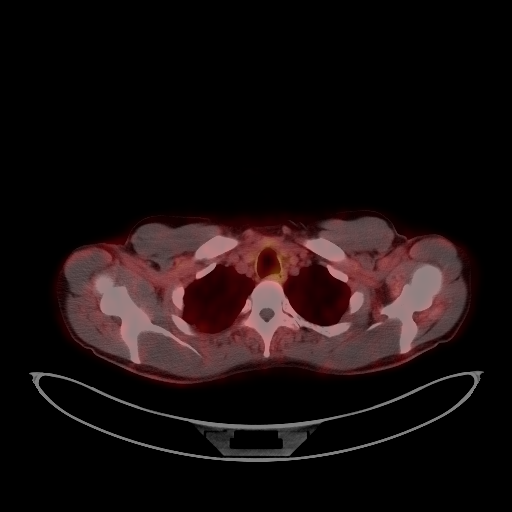
[im 249/249]
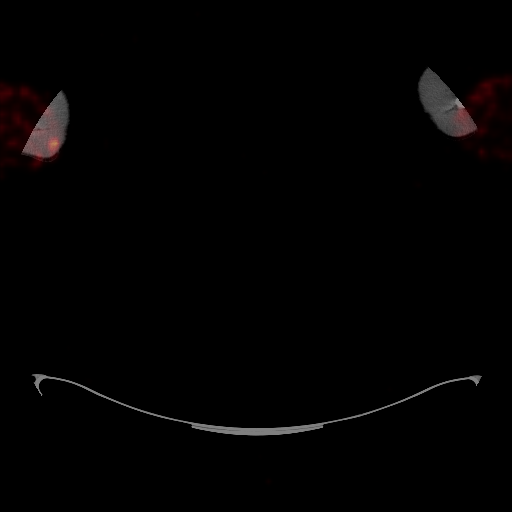

[25 of 25 positions shown; findings below may reference images not displayed]

FINDINGS: NECK

No radiotracer activity in neck lymph nodes.

Incidental CT finding: None

CHEST

No radiotracer accumulation within mediastinal or hilar lymph nodes.
No suspicious pulmonary nodules on the CT scan.

Incidental CT finding: None

ABDOMEN/PELVIS

Prostate: Post prostatectomy. Hypermetabolic thickening along the
ventral wall of the rectum. Nodular thickening measures 11 mm (image
[DATE]) with intense activity SUV max equal 30.5.

There is subtle peritoneal thickening with calcification anterior to
the RIGHT acetabulum (image 207/4) with SUV max 32.5.

Nodular thickening along the ventral peritoneal surface in the LEFT
lower quadrant measures 8 mm (image 207/4) with SUV max equal 30.9.

There is no discrete radiotracer avid pelvic lymph nodes.

There is intense focus of radiotracer activity in the porta hepatis
region with SUV max equal 43.5 (image 145). Difficult define lesion
on CT portion exam. Favored lymph node measuring 9 mm image 148)

There is focal linear activity within the parenchyma of the RIGHT
hepatic lobe with SUV max equal 9.5. There is is clearly above
background liver activity.

Lymph nodes: No abnormal radiotracer accumulation within pelvic or
abdominal nodes.

Liver: No evidence of liver metastasis

Incidental CT finding: None

SKELETON

No focal  activity to suggest skeletal metastasis.
IMPRESSION: 1. Unusual pattern of intensely radiotracer avid peritoneal
nodularity in the pelvis. Finding is most consistent with prostate
cancer metastasis. If relevant, LEFT lower quadrant ventral
peritoneal nodule may be accessible for biopsy.
2. Intense focus of radiotracer activity in the RIGHT hepatic lobe
is concerning for hepatic metastasis. The linear pattern is somewhat
unusual. Consider MRI with contrast for further evaluation.
3. Intense radiotracer avid presumed lymph node in the porta
hepatis.

ADDENDUM:
Upon further review, there is a focus of intense radiotracer
activity in the parasymphyseal medial RIGHT inferior pubic ramus.
Activity is intense and corresponds to a small 7 mm sclerotic lesion
(image 219/series 4). Radiotracer avid lesion is identified on image
66 of the fused data set PACS. Findings cyst consistent with
solitary skeletal prostate cancer metastasis.

*** End of Addendum ***
FINDINGS: NECK

No radiotracer activity in neck lymph nodes.

Incidental CT finding: None

CHEST

No radiotracer accumulation within mediastinal or hilar lymph nodes.
No suspicious pulmonary nodules on the CT scan.

Incidental CT finding: None

ABDOMEN/PELVIS

Prostate: Post prostatectomy. Hypermetabolic thickening along the
ventral wall of the rectum. Nodular thickening measures 11 mm (image
[DATE]) with intense activity SUV max equal 30.5.

There is subtle peritoneal thickening with calcification anterior to
the RIGHT acetabulum (image 207/4) with SUV max 32.5.

Nodular thickening along the ventral peritoneal surface in the LEFT
lower quadrant measures 8 mm (image 207/4) with SUV max equal 30.9.

There is no discrete radiotracer avid pelvic lymph nodes.

There is intense focus of radiotracer activity in the porta hepatis
region with SUV max equal 43.5 (image 145). Difficult define lesion
on CT portion exam. Favored lymph node measuring 9 mm image 148)

There is focal linear activity within the parenchyma of the RIGHT
hepatic lobe with SUV max equal 9.5. There is is clearly above
background liver activity.

Lymph nodes: No abnormal radiotracer accumulation within pelvic or
abdominal nodes.

Liver: No evidence of liver metastasis

Incidental CT finding: None

SKELETON

No focal  activity to suggest skeletal metastasis.
IMPRESSION: 1. Unusual pattern of intensely radiotracer avid peritoneal
nodularity in the pelvis. Finding is most consistent with prostate
cancer metastasis. If relevant, LEFT lower quadrant ventral
peritoneal nodule may be accessible for biopsy.
2. Intense focus of radiotracer activity in the RIGHT hepatic lobe
is concerning for hepatic metastasis. The linear pattern is somewhat
unusual. Consider MRI with contrast for further evaluation.
3. Intense radiotracer avid presumed lymph node in the porta
hepatis.

## 2023-12-22 ENCOUNTER — Other Ambulatory Visit: Payer: Self-pay

## 2023-12-22 DIAGNOSIS — Z9189 Other specified personal risk factors, not elsewhere classified: Secondary | ICD-10-CM | POA: Insufficient documentation

## 2023-12-22 DIAGNOSIS — C61 Malignant neoplasm of prostate: Secondary | ICD-10-CM

## 2023-12-22 NOTE — Progress Notes (Signed)
 Labs ordered for patient transitioning to me

## 2023-12-22 NOTE — Assessment & Plan Note (Signed)
 Continue AAP Lab every 3 months, call earlier if new concerns

## 2023-12-22 NOTE — Assessment & Plan Note (Signed)
 Supportive bone mineral density study Low bone mass (T-score -1.1 in 2022) Repeat this year Routine dental care calcium (1000-1200 mg daily from food and supplements) and vitamin D3 (1000 IU daily) Control and prevent diabetes Aggressive cardiovascular risk management Weight-bearing exercises (30 minutes per day) Limit alcohol consumption and avoid smoking

## 2023-12-22 NOTE — Progress Notes (Unsigned)
 [facility] CONSULT NOTE  Patient Care Team: Pcp, No as PCP - General Malachy Mood, MD as Attending Physician (Hematology and Oncology)  ASSESSMENT & PLAN:  [name] is a 68 y.o.male with history of HTN, PVC being seen at Medical Oncology Clinic for prostate cancer. He was initially found to have prostate cancer in 2010 underwent RP with final pathology of pT2cN0. He had rising PSA in 2012 and did not establish care until 2016 and started intermittent ADT.  His PSA nadired to 0.15 in June 2020 but started to rise since that time.  His PSA on November 13, 2021 was 3.22 and PET showed peritoneal carcinomatosis.  He has been placed on AAP since 01/2022 and PSA has been <0.1.  Initial diagnosis: 2010 with a PSA of 10 and Gleason score 3+4=7 post radical prostatectomy (01/29/09) pT2c, pN0, cM0. 2012 rising PSA but failed to reestablish care until 2016 with a PSA up to 21.82. started intermittent ADT in 2016.  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 MSS Treatment: He is on Eligard injection every 6 months here. Will receive today 01/09/22 Zytiga 1000 mg daily with prednisone started.  His dose was reduced to 500 mg in May 2023 due to side effects.  He is clinically doing well, his PSA has been low <1.0, will continue current therapy.  Discussed PET for evaluation.  If oligometastasis, suggest residual disease, recommend radiation for consolidation.  He understands.  PET scan has been ordered today.  The patient was counseled on the natural history of prostate cancer and the standard treatment options that are available for prostate cancer.   Prostate cancer (HCC) Continue AAP Lab every 3 months, call earlier if new concerns  At risk for side effect of medication Supportive bone mineral density study Low bone mass (T-score -1.1 in 2022) Repeat this year Routine dental care calcium (1000-1200 mg daily from food and  supplements) and vitamin D3 (1000 IU daily) Control and prevent diabetes Aggressive cardiovascular risk management Weight-bearing exercises (30 minutes per day) Limit alcohol consumption and avoid smoking  HTN (hypertension) On losartan-hydrochlorothiazide 100-25 mg daily Continue to monitor closely at home.  See me in about a month to discuss PSMA PET results.  Orders Placed This Encounter  Procedures   NM PET (PSMA) SKULL TO MID THIGH    Standing Status:   Future    Expected Date:   01/07/2024    Expiration Date:   12/23/2024    If indicated for the ordered procedure, I authorize the administration of a radiopharmaceutical per Radiology protocol:   Yes    Preferred imaging location?:   [facility]    All questions were answered. The patient knows to call the clinic with any problems, questions or concerns. No barriers to learning was detected.  Melven Sartorius, MD 3/20/20259:29 AM  CHIEF COMPLAINTS/PURPOSE OF CONSULTATION:  Prostate cancer  HISTORY OF PRESENTING ILLNESS:  Cody [name] 68 y.o. male is here because of prostate cancer. No nausea, vomiting or diarrhea. No more hot flashes. He is still working at [employer] and energy is good. No significant fatigue. No trouble urinating, stomach pain or nausea, or vomiting. No chest pain, focal weakness. No short of breath.  BP is ok at home, now and then spike up. Rarely high. Mostly about 130's SBP.  I have reviewed his chart and materials related to his cancer extensively and collaborated history with the patient. Summary  of oncologic history is as follows: Oncology History Overview Note   Cancer Staging  Prostate cancer [facility]) Staging form: Prostate, AJCC 8th Edition - Clinical: Stage IVB (cTX, cNX, pM1, PSA: 10, Grade Group: 3) - Signed by Benjiman Core, MD on 01/03/2022 Prostate specific antigen (PSA) range: 10 to 19 Gleason score: 7 Histologic grading system: 5 grade system     Prostate cancer (HCC)   12/02/2021 PET scan   PSMA PET IMPRESSION: 1. Unusual pattern of intensely radiotracer avid peritoneal nodularity in the pelvis. Finding is most consistent with prostate cancer metastasis. If relevant, LEFT lower quadrant ventral peritoneal nodule may be accessible for biopsy. 2. Intense focus of radiotracer activity in the RIGHT hepatic lobe is concerning for hepatic metastasis. The linear pattern is somewhat unusual. Consider MRI with contrast for further evaluation. 3. Intense radiotracer avid presumed lymph node in the porta hepatis.   01/03/2022 Initial Diagnosis   Prostate cancer (HCC)   01/03/2022 Cancer Staging   Staging form: Prostate, AJCC 8th Edition - Clinical: Stage IVB (cTX, cNX, pM1, PSA: 10, Grade Group: 3) - Signed by Benjiman Core, MD on 01/03/2022 Prostate specific antigen (PSA) range: 10 to 19 Gleason score: 7 Histologic grading system: 5 grade system     MEDICAL HISTORY:  Past Medical History:  Diagnosis Date   Cancer [facility])    Prostate    SURGICAL HISTORY: Past Surgical History:  Procedure Laterality Date   PROSTATE SURGERY     TONSILLECTOMY      SOCIAL HISTORY: Social History   Socioeconomic History   Marital status: Legally Separated    Spouse name: Not on file   Number of children: Not on file   Years of education: Not on file   Highest education level: Not on file  Occupational History   Not on file  Tobacco Use   Smoking status: Never   Smokeless tobacco: Never  Vaping Use   Vaping status: Never Used  Substance and Sexual Activity   Alcohol use: Not Currently   Drug use: Yes    Types: Marijuana   Sexual activity: Never  Other Topics Concern   Not on file  Social History Narrative   Not on file   Social Drivers of Health   Financial Resource Strain: Not on file  Food Insecurity: Not on file  Transportation Needs: Not on file  Physical Activity: Not on file  Stress: Not on file  Social Connections: Not on file  Intimate  Partner Violence: Not on file    FAMILY HISTORY: No family history on file.  ALLERGIES:  has no known allergies.  MEDICATIONS:  Current Outpatient Medications  Medication Sig Dispense Refill   abiraterone acetate (ZYTIGA) 250 MG tablet TAKE 2 TABLETS BY MOUTH ONCE  DAILY ON AN EMPTY STOMACH 1 HOUR BEFORE OR 2 HOURS AFTER A MEAL 60 tablet 5   ibuprofen (ADVIL) 600 MG tablet Take 1 tablet (600 mg total) by mouth every 8 (eight) hours as needed for mild pain. 30 tablet 0   losartan-hydrochlorothiazide (HYZAAR) 100-25 MG tablet Take 1 tablet by mouth every morning.     No current facility-administered medications for this visit.    REVIEW OF SYSTEMS:   All relevant systems were reviewed with the patient and are negative.  PHYSICAL EXAMINATION: ECOG PERFORMANCE STATUS: 0 - Asymptomatic  Vitals:   12/24/23 0904 12/24/23 0905  BP: (!) 152/66 (!) 141/63  Pulse: 62   Resp: 16   Temp: 97.9 F (36.6 C)  SpO2: 100%    Filed Weights   12/24/23 0904  Weight: 145 lb 3.2 oz (65.9 kg)    GENERAL: alert, no distress and comfortable Musculoskeletal: no edema  LABORATORY DATA:  I have reviewed the data as listed Lab Results  Component Value Date   WBC 9.4 09/22/2023   HGB 12.1 (L) 09/22/2023   HCT 35.4 (L) 09/22/2023   MCV 87.2 09/22/2023   PLT 261 09/22/2023   Recent Labs    03/19/23 1237 06/24/23 0924 09/22/23 0919  NA 138 139 138  K 3.8 3.5 3.5  CL 100 99 98  CO2 32 33* 35*  GLUCOSE 92 115* 128*  BUN 25* 21 21  CREATININE 0.95 0.87 0.91  CALCIUM 9.6 9.7 9.7  GFRNONAA >60 >60 >60  PROT 7.2 7.7 6.8  ALBUMIN 4.0 4.3 4.2  AST 21 31 28   ALT 15 26 22   ALKPHOS 84 103 84  BILITOT 0.4 0.5 0.7    RADIOGRAPHIC STUDIES: I have personally reviewed the radiological images as listed and agreed with the findings in the report. No results found.

## 2023-12-23 ENCOUNTER — Inpatient Hospital Stay: Payer: Medicare HMO | Admitting: Hematology

## 2023-12-23 ENCOUNTER — Inpatient Hospital Stay: Payer: Medicare HMO

## 2023-12-24 ENCOUNTER — Inpatient Hospital Stay: Payer: Medicare HMO

## 2023-12-24 ENCOUNTER — Inpatient Hospital Stay: Payer: Medicare HMO | Attending: Hematology

## 2023-12-24 ENCOUNTER — Inpatient Hospital Stay (HOSPITAL_BASED_OUTPATIENT_CLINIC_OR_DEPARTMENT_OTHER): Payer: Medicare HMO

## 2023-12-24 VITALS — BP 141/63 | HR 62 | Temp 97.9°F | Resp 16 | Wt 145.2 lb

## 2023-12-24 DIAGNOSIS — F1290 Cannabis use, unspecified, uncomplicated: Secondary | ICD-10-CM | POA: Diagnosis not present

## 2023-12-24 DIAGNOSIS — Z79899 Other long term (current) drug therapy: Secondary | ICD-10-CM | POA: Diagnosis not present

## 2023-12-24 DIAGNOSIS — I10 Essential (primary) hypertension: Secondary | ICD-10-CM | POA: Insufficient documentation

## 2023-12-24 DIAGNOSIS — C61 Malignant neoplasm of prostate: Secondary | ICD-10-CM | POA: Diagnosis not present

## 2023-12-24 DIAGNOSIS — Z9189 Other specified personal risk factors, not elsewhere classified: Secondary | ICD-10-CM

## 2023-12-24 DIAGNOSIS — C786 Secondary malignant neoplasm of retroperitoneum and peritoneum: Secondary | ICD-10-CM | POA: Insufficient documentation

## 2023-12-24 DIAGNOSIS — I159 Secondary hypertension, unspecified: Secondary | ICD-10-CM

## 2023-12-24 LAB — CMP (CANCER CENTER ONLY)
ALT: 16 U/L (ref 0–44)
AST: 23 U/L (ref 15–41)
Albumin: 4.3 g/dL (ref 3.5–5.0)
Alkaline Phosphatase: 96 U/L (ref 38–126)
Anion gap: 8 (ref 5–15)
BUN: 28 mg/dL — ABNORMAL HIGH (ref 8–23)
CO2: 30 mmol/L (ref 22–32)
Calcium: 9.7 mg/dL (ref 8.9–10.3)
Chloride: 99 mmol/L (ref 98–111)
Creatinine: 0.94 mg/dL (ref 0.61–1.24)
GFR, Estimated: >60 mL/min (ref >60)
Glucose, Bld: 131 mg/dL — ABNORMAL HIGH (ref 70–99)
Potassium: 3.5 mmol/L (ref 3.5–5.1)
Sodium: 137 mmol/L (ref 135–145)
Total Bilirubin: 0.3 mg/dL (ref 0.0–1.2)
Total Protein: 6.9 g/dL (ref 6.5–8.1)

## 2023-12-24 MED ORDER — LEUPROLIDE ACETATE (6 MONTH) 45 MG ~~LOC~~ KIT
45.0000 mg | PACK | Freq: Once | SUBCUTANEOUS | Status: AC
  Administered 2023-12-24: 45 mg via SUBCUTANEOUS
  Filled 2023-12-24: qty 45

## 2023-12-24 NOTE — Assessment & Plan Note (Signed)
 On losartan-hydrochlorothiazide 100-25 mg daily Continue to monitor closely at home.

## 2023-12-26 LAB — PROSTATE-SPECIFIC AG, SERUM (LABCORP): Prostate Specific Ag, Serum: <0.1 ng/mL (ref 0.0–4.0)

## 2024-01-13 ENCOUNTER — Encounter: Payer: Self-pay | Admitting: Hematology

## 2024-01-14 ENCOUNTER — Encounter (HOSPITAL_COMMUNITY)
Admission: RE | Admit: 2024-01-14 | Discharge: 2024-01-14 | Disposition: A | Discharge status: Home / Self Care | Source: Ambulatory Visit

## 2024-01-14 DIAGNOSIS — C61 Malignant neoplasm of prostate: Secondary | ICD-10-CM | POA: Diagnosis present

## 2024-01-14 MED ORDER — FLUDEOXYGLUCOSE F - 18 (FDG) INJECTION
7.3010 | Freq: Once | INTRAVENOUS | Status: AC
  Administered 2024-01-14: 7.301 via INTRAVENOUS

## 2024-01-25 NOTE — Assessment & Plan Note (Addendum)
 Continue AAP Lab every 3 months, call earlier if new concerns

## 2024-01-25 NOTE — Progress Notes (Signed)
 [facility] OFFICE PROGRESS NOTE  Patient Care Team: Health, [address] as PCP - Drinda Gentry, MD as Attending Physician (Hematology and Oncology)  Cody [name] is a 68 y.o.male with history of HTN, PVC being seen at Medical Oncology Clinic for prostate cancer. He was initially found to have prostate cancer in 2010 underwent RP with final pathology of pT2cN0. He had rising PSA in 2012 and did not establish care until 2016 and started intermittent ADT.  His PSA nadired to 0.15 in June 2020 but started to rise since that time.  His PSA on November 13, 2021 was 3.22 and PET showed peritoneal carcinomatosis.  He has been placed on AAP since 01/2022 and PSA has been <0.1.   Initial diagnosis: 2010 with a PSA of 10 and Gleason score 3+4=7 post radical prostatectomy (01/29/09) pT2c, pN0, cM0. 2012 rising PSA but failed to reestablish care until 2016 with a PSA up to 21.82. started intermittent ADT in 2016.  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 MSS Treatment: He is on Eligard  injection every 6 months here. Last received on 01/26/24. 01/09/22 Zytiga  1000 mg daily with prednisone  started.  His dose was reduced to 500 mg in May 2023 due to side effects.  He is clinically doing well, his PSA has been low <1.0, will continue current therapy.  Discussed and reviewed PET scan result.  There is much improvement from previously and resolution of some areas. Report of a focus of radiotracer activity remains in the region of the RIGHT prostatectomy bed. A focus of residual activity in the LEFT ventral peritoneal space. an intense metabolic activity in the region of the porta hepatis. Discussed pros and cons of radiation. He is interested continuing the current course of treatment at this time to avoid more side effects. Will continue AAP and ADT and monitoring.  Assessment & Plan Prostate cancer [facility]) Continue AAP Lab in  July, call earlier if new concerns Secondary hypertension On losartan-hydrochlorothiazide 100-25 mg daily Continue to monitor closely at home. At risk for side effect of medication Supportive bone mineral density study Low bone mass (T-score -1.1 in 2022) Repeat this year Routine dental care calcium (1000-1200 mg daily from food and supplements) and vitamin D3 (1000 IU daily) Control and prevent diabetes Aggressive cardiovascular risk management Weight-bearing exercises (30 minutes per day) Limit alcohol consumption and avoid smoking  Orders Placed This Encounter  Procedures   CBC with Differential (Cancer Center Only)    Standing Status:   Future    Expiration Date:   01/25/2025   CMP (Cancer Center only)    Standing Status:   Future    Expiration Date:   01/25/2025   Testosterone     Standing Status:   Future    Expiration Date:   01/25/2025   Prostate-Specific AG, Serum    Standing Status:   Future    Expiration Date:   01/25/2025   Lactate dehydrogenase    Standing Status:   Future    Expiration Date:   01/25/2025     Lowanda Ruddy, MD  INTERVAL HISTORY: he returns for treatment follow-up. Tolerating current treatment. Some fatigue.  Oncology History Overview Note   Cancer Staging  Prostate cancer [facility]) Staging form: Prostate, AJCC 8th Edition - Clinical: Stage IVB (cTX, cNX, pM1, PSA: 10, Grade Group: 3) - Signed by Renna Cary, MD on 01/03/2022 Prostate specific antigen (PSA) range: 10 to 19 Gleason score:  7 Histologic grading system: 5 grade system     Prostate cancer (HCC)  12/02/2021 PET scan   PSMA PET IMPRESSION: 1. Unusual pattern of intensely radiotracer avid peritoneal nodularity in the pelvis. Finding is most consistent with prostate cancer metastasis. If relevant, LEFT lower quadrant ventral peritoneal nodule may be accessible for biopsy. 2. Intense focus of radiotracer activity in the RIGHT hepatic lobe is concerning for hepatic metastasis.  The linear pattern is somewhat unusual. Consider MRI with contrast for further evaluation. 3. Intense radiotracer avid presumed lymph node in the porta hepatis.   01/03/2022 Initial Diagnosis   Prostate cancer (HCC)   01/03/2022 Cancer Staging   Staging form: Prostate, AJCC 8th Edition - Clinical: Stage IVB (cTX, cNX, pM1, PSA: 10, Grade Group: 3) - Signed by Renna Cary, MD on 01/03/2022 Prostate specific antigen (PSA) range: 10 to 19 Gleason score: 7 Histologic grading system: 5 grade system      PHYSICAL EXAMINATION: ECOG PERFORMANCE STATUS: 1 - Symptomatic but completely ambulatory  Vitals:   01/26/24 0950  BP: 124/62  Pulse: 60  Resp: 18  Temp: 98.4 F (36.9 C)  SpO2: 100%   Filed Weights   01/26/24 0950  Weight: 151 lb 4.8 oz (68.6 kg)    Relevant data reviewed during this visit included labs and imaging.

## 2024-01-25 NOTE — Assessment & Plan Note (Addendum)
 On losartan-hydrochlorothiazide 100-25 mg daily Continue to monitor closely at home.

## 2024-01-25 NOTE — Assessment & Plan Note (Addendum)
 Supportive bone mineral density study Low bone mass (T-score -1.1 in 2022) Repeat this year Routine dental care calcium (1000-1200 mg daily from food and supplements) and vitamin D3 (1000 IU daily) Control and prevent diabetes Aggressive cardiovascular risk management Weight-bearing exercises (30 minutes per day) Limit alcohol consumption and avoid smoking

## 2024-01-26 ENCOUNTER — Inpatient Hospital Stay: Attending: Hematology

## 2024-01-26 ENCOUNTER — Telehealth: Payer: Self-pay

## 2024-01-26 ENCOUNTER — Inpatient Hospital Stay

## 2024-01-26 VITALS — BP 124/62 | HR 60 | Temp 98.4°F | Resp 18 | Ht 70.0 in | Wt 151.3 lb

## 2024-01-26 DIAGNOSIS — C61 Malignant neoplasm of prostate: Secondary | ICD-10-CM | POA: Diagnosis present

## 2024-01-26 DIAGNOSIS — C786 Secondary malignant neoplasm of retroperitoneum and peritoneum: Secondary | ICD-10-CM | POA: Insufficient documentation

## 2024-01-26 DIAGNOSIS — R5383 Other fatigue: Secondary | ICD-10-CM | POA: Diagnosis not present

## 2024-01-26 DIAGNOSIS — I10 Essential (primary) hypertension: Secondary | ICD-10-CM | POA: Diagnosis not present

## 2024-01-26 DIAGNOSIS — Z9189 Other specified personal risk factors, not elsewhere classified: Secondary | ICD-10-CM

## 2024-01-26 DIAGNOSIS — I159 Secondary hypertension, unspecified: Secondary | ICD-10-CM | POA: Diagnosis not present

## 2024-01-26 DIAGNOSIS — Z79899 Other long term (current) drug therapy: Secondary | ICD-10-CM | POA: Diagnosis not present

## 2024-01-26 LAB — CBC WITH DIFFERENTIAL (CANCER CENTER ONLY)
Abs Immature Granulocytes: 0.02 10*3/uL (ref 0.00–0.07)
Basophils Absolute: 0.1 10*3/uL (ref 0.0–0.1)
Basophils Relative: 1 %
Eosinophils Absolute: 0.5 10*3/uL (ref 0.0–0.5)
Eosinophils Relative: 6 %
HCT: 38.2 % — ABNORMAL LOW (ref 39.0–52.0)
Hemoglobin: 13.2 g/dL (ref 13.0–17.0)
Immature Granulocytes: 0 %
Lymphocytes Relative: 27 %
Lymphs Abs: 2.4 10*3/uL (ref 0.7–4.0)
MCH: 29.7 pg (ref 26.0–34.0)
MCHC: 34.6 g/dL (ref 30.0–36.0)
MCV: 85.8 fL (ref 80.0–100.0)
Monocytes Absolute: 0.7 10*3/uL (ref 0.1–1.0)
Monocytes Relative: 8 %
Neutro Abs: 5.2 10*3/uL (ref 1.7–7.7)
Neutrophils Relative %: 58 %
RBC: 4.45 MIL/uL (ref 4.22–5.81)
RDW: 12.5 % (ref 11.5–15.5)
Smear Review: ADEQUATE
WBC Count: 8.8 10*3/uL (ref 4.0–10.5)
nRBC: 0 % (ref 0.0–0.2)

## 2024-01-26 LAB — CMP (CANCER CENTER ONLY)
ALT: 16 U/L (ref 0–44)
AST: 24 U/L (ref 15–41)
Albumin: 4.5 g/dL (ref 3.5–5.0)
Alkaline Phosphatase: 100 U/L (ref 38–126)
Anion gap: 8 (ref 5–15)
BUN: 20 mg/dL (ref 8–23)
CO2: 31 mmol/L (ref 22–32)
Calcium: 9.9 mg/dL (ref 8.9–10.3)
Chloride: 99 mmol/L (ref 98–111)
Creatinine: 0.84 mg/dL (ref 0.61–1.24)
GFR, Estimated: >60 mL/min (ref >60)
Glucose, Bld: 109 mg/dL — ABNORMAL HIGH (ref 70–99)
Potassium: 3.3 mmol/L — ABNORMAL LOW (ref 3.5–5.1)
Sodium: 138 mmol/L (ref 135–145)
Total Bilirubin: 0.4 mg/dL (ref 0.0–1.2)
Total Protein: 7.2 g/dL (ref 6.5–8.1)

## 2024-01-26 LAB — LACTATE DEHYDROGENASE: LDH: 171 U/L (ref 98–192)

## 2024-01-26 NOTE — Telephone Encounter (Signed)
-----   Message from Lowanda Ruddy sent at 01/26/2024  1:59 PM EDT ----- Borderline low potassium. Please let him know to increase potassium rich food. Thanks.

## 2024-01-26 NOTE — Telephone Encounter (Signed)
 TC to inform of the below message by Dr. Alita Irwin. Reviewed foods that are high in potassium, and pt states she eats bananas and potatoes regularly. Suggested that he increase intake of these and perhaps add more oranges and green, leafy vegetables to his diet. Pt verbalizes understanding.

## 2024-01-27 LAB — PROSTATE-SPECIFIC AG, SERUM (LABCORP): Prostate Specific Ag, Serum: <0.1 ng/mL (ref 0.0–4.0)

## 2024-01-27 LAB — TESTOSTERONE: Testosterone: <3 ng/dL — ABNORMAL LOW (ref 264–916)

## 2024-03-16 ENCOUNTER — Other Ambulatory Visit: Payer: Self-pay | Admitting: Hematology

## 2024-03-16 DIAGNOSIS — C61 Malignant neoplasm of prostate: Secondary | ICD-10-CM

## 2024-03-17 ENCOUNTER — Encounter: Payer: Self-pay | Admitting: Hematology

## 2024-04-25 NOTE — Progress Notes (Signed)
 [facility] OFFICE PROGRESS NOTE  Patient Care Team: Health, [address] as PCP - General  Cody [name] is a 68 y.o.male with history of HTN, PVC being seen at Medical Oncology Clinic for prostate cancer. He was initially found to have prostate cancer in 2010 underwent RP with final pathology of pT2cN0. He had rising PSA in 2012 and did not establish care until 2016 and started intermittent ADT.  His PSA nadired to 0.15 in June 2020 but started to rise since that time.  His PSA on November 13, 2021 was 3.22 and PET showed peritoneal carcinomatosis.  He has been placed on AAP since 01/2022 and PSA has been <0.1.   Initial diagnosis: 2010 with a PSA of 10 and Gleason score 3+4=7 post radical prostatectomy (01/29/09) pT2c, pN0, cM0. 2012 rising PSA but failed to reestablish care until 2016 with a PSA up to 21.82. started intermittent ADT in 2016.  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 MSS Treatment: He is on Eligard  injection every 6 months here. Last received on 01/26/24. 01/09/22 Zytiga  1000 mg daily with prednisone  started.  His dose was reduced to 500 mg in May 2023 due to side effects.   He is clinically doing well, his PSA has been low <1.0. 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 Will switch to darolutamide .   Return on 9/16 with labs, ADT and MD visit. Assessment & Plan Prostate cancer (HCC) Switch to darolutamide  600 mg twice daily Last leuprolide  at 6 mo dosage on 12/24/23  Return in September with ADT and lab and MD visit At risk for side effect of medication Supportive bone mineral density study Low bone mass (T-score -1.1 in 2022) Repeat this year He has dentures calcium (1000-1200 mg daily from food and supplements) and vitamin D3 (1000 IU daily) Control and prevent diabetes Aggressive cardiovascular risk management Weight-bearing exercises  (30 minutes per day) Limit alcohol consumption and avoid smoking Secondary hypertension On losartan-hydrochlorothiazide 100-25 mg daily Continue to monitor closely at home.  Orders Placed This Encounter  Procedures   CBC with Differential (Cancer Center Only)    Standing Status:   Future    Expiration Date:   04/26/2025   CMP (Cancer Center only)    Standing Status:   Future    Expiration Date:   04/26/2025   Prostate-Specific AG, Serum    Standing Status:   Future    Expiration Date:   04/26/2025   Testosterone     Standing Status:   Future    Expiration Date:   04/26/2025     Cody JAYSON Chihuahua, MD  INTERVAL HISTORY: Patient returns for follow-up. He is feeling well but having sweats under the arms that is consistently. Report of decrease appetite. BP is fine at home. 130's SBP. No heart racing or chest pain, coughing or short of breath. No lump or mass, stomach pain, diarrhea, n/v, trouble urinating.   He is working at [employer].  Oncology History Overview Note   Cancer Staging  Prostate cancer [facility]) Staging form: Prostate, AJCC 8th Edition - Clinical: Stage IVB (cTX, cNX, pM1, PSA: 10, Grade Group: 3) - Signed by Amadeo Windell SAILOR, MD on 01/03/2022 Prostate specific antigen (PSA) range: 10 to 19 Gleason score: 7 Histologic grading system: 5 grade system     Prostate cancer (HCC)  12/02/2021 PET scan   PSMA PET IMPRESSION: 1. Unusual pattern of  intensely radiotracer avid peritoneal nodularity in the pelvis. Finding is most consistent with prostate cancer metastasis. If relevant, LEFT lower quadrant ventral peritoneal nodule may be accessible for biopsy. 2. Intense focus of radiotracer activity in the RIGHT hepatic lobe is concerning for hepatic metastasis. The linear pattern is somewhat unusual. Consider MRI with contrast for further evaluation. 3. Intense radiotracer avid presumed lymph node in the porta hepatis.   01/03/2022 Initial Diagnosis   Prostate cancer  (HCC)   01/03/2022 Cancer Staging   Staging form: Prostate, AJCC 8th Edition - Clinical: Stage IVB (cTX, cNX, pM1, PSA: 10, Grade Group: 3) - Signed by Amadeo Windell SAILOR, MD on 01/03/2022 Prostate specific antigen (PSA) range: 10 to 19 Gleason score: 7 Histologic grading system: 5 grade system   01/2022 -  Chemotherapy   Started AAP   01/14/2024 PET scan   PET scan result.  There is much improvement from previously and resolution of some areas. Report of a focus of radiotracer activity remains in the region of the RIGHT prostatectomy bed. A focus of residual activity in the LEFT ventral peritoneal space. an intense metabolic activity in the region of the porta hepatis. Discussed pros and cons of radiation. He is interested continuing the current course of treatment at this time to avoid more side effects. Continued on AAP and ADT and monitoring.      PHYSICAL EXAMINATION: ECOG PERFORMANCE STATUS: 0 - Asymptomatic  Vitals:   04/26/24 0954  BP: 132/88  Pulse: 62  Resp: 16  Temp: 97.6 F (36.4 C)  SpO2: 100%   Filed Weights   04/26/24 0954  Weight: 145 lb 9 oz (66 kg)    GENERAL: alert, no distress and comfortable LUNGS: clear to auscultation and percussion with normal breathing effort HEART: regular rate & rhythm  ABDOMEN: abdomen soft, non-tender and nondistended. Musculoskeletal: no edema   Relevant data reviewed during this visit included labs.

## 2024-04-25 NOTE — Assessment & Plan Note (Addendum)
 Supportive bone mineral density study Low bone mass (T-score -1.1 in 2022) Repeat this year He has dentures calcium (1000-1200 mg daily from food and supplements) and vitamin D3 (1000 IU daily) Control and prevent diabetes Aggressive cardiovascular risk management Weight-bearing exercises (30 minutes per day) Limit alcohol consumption and avoid smoking

## 2024-04-25 NOTE — Assessment & Plan Note (Addendum)
 Switch to darolutamide  600 mg twice daily Last leuprolide  at 6 mo dosage on 12/24/23  Return in September with ADT and lab and MD visit

## 2024-04-25 NOTE — Assessment & Plan Note (Addendum)
 On losartan-hydrochlorothiazide 100-25 mg daily Continue to monitor closely at home.

## 2024-04-26 ENCOUNTER — Telehealth: Payer: Self-pay

## 2024-04-26 ENCOUNTER — Inpatient Hospital Stay: Attending: Hematology

## 2024-04-26 ENCOUNTER — Other Ambulatory Visit (HOSPITAL_COMMUNITY): Payer: Self-pay

## 2024-04-26 ENCOUNTER — Inpatient Hospital Stay (HOSPITAL_BASED_OUTPATIENT_CLINIC_OR_DEPARTMENT_OTHER)

## 2024-04-26 ENCOUNTER — Encounter: Payer: Self-pay | Admitting: Hematology

## 2024-04-26 VITALS — BP 132/88 | HR 62 | Temp 97.6°F | Resp 16 | Ht 70.0 in | Wt 145.6 lb

## 2024-04-26 DIAGNOSIS — C786 Secondary malignant neoplasm of retroperitoneum and peritoneum: Secondary | ICD-10-CM | POA: Insufficient documentation

## 2024-04-26 DIAGNOSIS — I159 Secondary hypertension, unspecified: Secondary | ICD-10-CM

## 2024-04-26 DIAGNOSIS — C61 Malignant neoplasm of prostate: Secondary | ICD-10-CM | POA: Diagnosis not present

## 2024-04-26 DIAGNOSIS — Z79899 Other long term (current) drug therapy: Secondary | ICD-10-CM | POA: Insufficient documentation

## 2024-04-26 DIAGNOSIS — Z9189 Other specified personal risk factors, not elsewhere classified: Secondary | ICD-10-CM

## 2024-04-26 LAB — CBC WITH DIFFERENTIAL (CANCER CENTER ONLY)
Abs Immature Granulocytes: 0.02 K/uL (ref 0.00–0.07)
Basophils Absolute: 0.1 K/uL (ref 0.0–0.1)
Basophils Relative: 1 %
Eosinophils Absolute: 0.4 K/uL (ref 0.0–0.5)
Eosinophils Relative: 4 %
HCT: 35.1 % — ABNORMAL LOW (ref 39.0–52.0)
Hemoglobin: 11.9 g/dL — ABNORMAL LOW (ref 13.0–17.0)
Immature Granulocytes: 0 %
Lymphocytes Relative: 29 %
Lymphs Abs: 2.8 K/uL (ref 0.7–4.0)
MCH: 29.3 pg (ref 26.0–34.0)
MCHC: 33.9 g/dL (ref 30.0–36.0)
MCV: 86.5 fL (ref 80.0–100.0)
Monocytes Absolute: 0.7 K/uL (ref 0.1–1.0)
Monocytes Relative: 8 %
Neutro Abs: 5.5 K/uL (ref 1.7–7.7)
Neutrophils Relative %: 58 %
Platelet Count: 227 K/uL (ref 150–400)
RBC: 4.06 MIL/uL — ABNORMAL LOW (ref 4.22–5.81)
RDW: 12.8 % (ref 11.5–15.5)
WBC Count: 9.4 K/uL (ref 4.0–10.5)
nRBC: 0 % (ref 0.0–0.2)

## 2024-04-26 LAB — CMP (CANCER CENTER ONLY)
ALT: 16 U/L (ref 0–44)
AST: 23 U/L (ref 15–41)
Albumin: 4.2 g/dL (ref 3.5–5.0)
Alkaline Phosphatase: 99 U/L (ref 38–126)
Anion gap: 6 (ref 5–15)
BUN: 26 mg/dL — ABNORMAL HIGH (ref 8–23)
CO2: 32 mmol/L (ref 22–32)
Calcium: 9.7 mg/dL (ref 8.9–10.3)
Chloride: 101 mmol/L (ref 98–111)
Creatinine: 0.9 mg/dL (ref 0.61–1.24)
GFR, Estimated: >60 mL/min (ref >60)
Glucose, Bld: 137 mg/dL — ABNORMAL HIGH (ref 70–99)
Potassium: 3.1 mmol/L — ABNORMAL LOW (ref 3.5–5.1)
Sodium: 139 mmol/L (ref 135–145)
Total Bilirubin: 0.4 mg/dL (ref 0.0–1.2)
Total Protein: 7.5 g/dL (ref 6.5–8.1)

## 2024-04-26 LAB — LACTATE DEHYDROGENASE: LDH: 167 U/L (ref 98–192)

## 2024-04-26 MED ORDER — DAROLUTAMIDE 300 MG PO TABS
600.0000 mg | ORAL_TABLET | Freq: Two times a day (BID) | ORAL | 11 refills | Status: AC
  Filled 2024-04-27: qty 120, 30d supply, fill #0
  Filled 2024-05-17: qty 120, 30d supply, fill #1
  Filled 2024-06-29: qty 120, 30d supply, fill #2
  Filled 2024-07-26: qty 120, 30d supply, fill #3
  Filled 2024-08-19: qty 120, 30d supply, fill #4
  Filled 2024-09-16: qty 120, 30d supply, fill #5
  Filled 2024-10-20: qty 120, 30d supply, fill #6

## 2024-04-26 NOTE — Telephone Encounter (Signed)
 Oral Oncology Pharmacist Encounter  Received new prescription for Nubeqa  (darolutamide ) for the treatment of metastatic hormone sensitive prostate cancer in conjunction with Eligard , planned duration until disease progression or unacceptable toxicity. Switching from abiraterone  to darolutamide  due to side effects.   Labs from 04/26/2024 (CBC and CMP) assessed, no interventions needed. Prescription dose and frequency assessed for appropriateness.   Current medication list in Epic reviewed, no significant/ relevant DDIs with Nubeqa  identified.   Evaluated chart and no patient barriers to medication adherence noted.   Patient agreement for treatment documented in MD note on 04/26/2024.  Prescription has been e-scribed to the [facility] for benefits analysis and approval.  Oral Oncology Clinic will continue to follow for insurance authorization, copayment issues, initial counseling and start date.  Clifford Benninger, PharmD Hematology/Oncology Clinical Pharmacist [facility] Oral Chemotherapy Navigation Clinic [phone] 04/26/2024 10:41 AM

## 2024-04-26 NOTE — Telephone Encounter (Signed)
 Oral Oncology Patient Advocate Encounter   Received notification that prior authorization for Nubeqa  is required.   PA submitted on 04/26/24 Key [id number] Status is pending      Charlott Hamilton,  CPhT-Adv  she/her/hers [facility]  [facility] Pharmacy Technician Patient Advocate Specialist III [facility] Phone: [phone]  Fax: [phone] [email or web address]

## 2024-04-27 ENCOUNTER — Telehealth: Payer: Self-pay

## 2024-04-27 ENCOUNTER — Other Ambulatory Visit: Payer: Self-pay

## 2024-04-27 ENCOUNTER — Other Ambulatory Visit (HOSPITAL_COMMUNITY): Payer: Self-pay

## 2024-04-27 ENCOUNTER — Encounter: Payer: Self-pay | Admitting: Hematology

## 2024-04-27 LAB — TESTOSTERONE: Testosterone: <3 ng/dL — ABNORMAL LOW (ref 264–916)

## 2024-04-27 LAB — PROSTATE-SPECIFIC AG, SERUM (LABCORP): Prostate Specific Ag, Serum: <0.1 ng/mL (ref 0.0–4.0)

## 2024-04-27 MED ORDER — PREDNISONE 5 MG PO TABS: 5.0000 mg | ORAL_TABLET | Freq: Every day | ORAL | 0 refills | Status: DC

## 2024-04-27 NOTE — Telephone Encounter (Signed)
 Oral Chemotherapy Pharmacist Encounter   Patient Education I spoke with patient for overview of new oral chemotherapy medication: Nubeqa  (darolutamide ) for the treatment of metastatic hormone sensitive prostate cancer in conjunction with Eligard , planned duration for Nubeqa  until disease progression or unacceptable drug toxicity.   Pt is doing well. Counseled patient on administration, dosing, side effects, monitoring, drug-food interactions, safe handling, storage, and disposal.   Patient will take Nubeqa  300 mg tablet, 2 tablets (600 mg total) by mouth 2 (two) times daily with a meal.   Patient knows to avoid grapefruit and grapefruit juice while on Nubeqa .   Start date: 04/30/2024   Side effects include but not limited to: changes in LFTs, HTN, fatigue.     Reviewed with patient importance of keeping a medication schedule and plan for any missed doses.   After discussion with patient no patient barriers to medication adherence identified. Patient knows to stop taking the Zytiga  the day prior to starting the Nubeqa . Since we were able to get the Nubeqa  to patient quickly, notified pt he does not need to pick up the prednisone . Patient aware and was thankful we were able to get the new medication to him quickly. Notified MD and prednisone  prescription removed.   Patient voiced understanding and appreciation. All questions answered. Medication handout provided.  Distress thermometer not completed during telephone call as patient has been on previous lines of therapy.   Provided patient with Oral Chemotherapy Navigation Clinic phone number. Patient knows to call the office with questions or concerns.  Dhamar Gregory, PharmD Hematology/Oncology Clinical Pharmacist [facility] Oral Chemotherapy Navigation Clinic [phone] 04/27/2024 12:57 PM

## 2024-04-27 NOTE — Telephone Encounter (Signed)
 Oral Oncology Patient Advocate Encounter  Prior Authorization for Nubeqa  has been approved.    Key: [id number] Effective dates: 10/07/23 through 10/05/24  Patients co-pay is $1,367.78.     Charlott Hamilton,  CPhT-Adv  she/her/hers [facility]  [facility] Pharmacy Technician Patient Advocate Specialist III [facility] Phone: [phone]  Fax: [phone] [email or web address]

## 2024-04-27 NOTE — Progress Notes (Signed)
 Called and spoke to patient on return call of pharmacy team to help set up grant for copay of his medication.

## 2024-04-27 NOTE — Progress Notes (Signed)
 Specialty Pharmacy Initial Fill Coordination Note  Cody [name] is a 68 y.o. male contacted today regarding refills of specialty medication(s) Darolutamide  (NUBEQA ) .  Patient requested Delivery  on 04/29/24  to verified address [address]  [place] [address]   Medication will be filled on 04/27/24.   Patient is aware of $0.00 copayment.

## 2024-04-27 NOTE — Progress Notes (Signed)
 Patient counseled on Nubeqa  in telephone encounter opened on 04/26/2024.

## 2024-04-27 NOTE — Telephone Encounter (Signed)
 Oral Oncology Patient Advocate Encounter  Was successful in securing patient a $6000 grant from [facility] to provide copayment coverage for Nubeqa .  This will keep the out of pocket expense at $0.     [employer] ID: [id number]   The billing information is as follows and has been shared with [facility]    RxBin: [id number] PCN: [id number] Member ID: [id number] Group ID: [id number] Dates of Eligibility: 03/28/24 through 03/27/25  Fund:  Prostate Cancer - Medicare Access   [place],  CPhT-Adv  she/her/hers [facility]  [facility] Specialty Pharmacy Services Pharmacy Technician Patient Advocate Specialist III [facility] Phone: [phone]  Fax: [phone] [email or web address]

## 2024-05-13 ENCOUNTER — Other Ambulatory Visit: Payer: Self-pay

## 2024-05-17 ENCOUNTER — Encounter (INDEPENDENT_AMBULATORY_CARE_PROVIDER_SITE_OTHER): Payer: Self-pay

## 2024-05-17 ENCOUNTER — Other Ambulatory Visit (HOSPITAL_COMMUNITY): Payer: Self-pay

## 2024-05-17 ENCOUNTER — Other Ambulatory Visit: Payer: Self-pay

## 2024-05-17 NOTE — Progress Notes (Signed)
 Specialty Pharmacy Refill Coordination Note  MyChart Questionnaire Submission  Cody [name] is a 68 y.o. male contacted today regarding refills of specialty medication(s) Nubeqa .  Doses on hand: (Patient-Rptd) A bottle (12 days per last refill)  Patient requested: (Patient-Rptd) Delivery   Delivery date: 05/23/24  Verified address: [address]  [place] [address]  Medication will be filled on 05/20/24.

## 2024-05-18 ENCOUNTER — Other Ambulatory Visit: Payer: Self-pay

## 2024-05-20 ENCOUNTER — Other Ambulatory Visit: Payer: Self-pay

## 2024-05-26 ENCOUNTER — Other Ambulatory Visit: Payer: Self-pay

## 2024-05-27 ENCOUNTER — Encounter (HOSPITAL_COMMUNITY): Payer: Self-pay

## 2024-05-27 ENCOUNTER — Other Ambulatory Visit: Payer: Self-pay

## 2024-05-30 ENCOUNTER — Other Ambulatory Visit (HOSPITAL_COMMUNITY): Payer: Self-pay

## 2024-05-30 ENCOUNTER — Other Ambulatory Visit: Payer: Self-pay

## 2024-06-02 ENCOUNTER — Other Ambulatory Visit: Payer: Self-pay

## 2024-06-20 ENCOUNTER — Other Ambulatory Visit (HOSPITAL_COMMUNITY): Payer: Self-pay

## 2024-06-20 NOTE — Assessment & Plan Note (Addendum)
 Supportive bone mineral density study Low bone mass (T-score -1.1 in 2022) Repeat this year He has dentures calcium (1000-1200 mg daily from food and supplements) and vitamin D3 (1000 IU daily) Control and prevent diabetes Aggressive cardiovascular risk management Weight-bearing exercises (30 minutes per day) Limit alcohol consumption and avoid smoking

## 2024-06-20 NOTE — Progress Notes (Unsigned)
**Cody [name] De-Identified via Obfuscation**  Cody Cody [name]  Patient Care Team: Health, [address] as PCP - General  Cody [name] is a 68 y.o.male with history of HTN, PVC being seen at Medical Oncology Clinic for prostate cancer. He was initially found to have prostate cancer in 2010 underwent RP with final pathology of pT2cN0. He had rising PSA in 2012 and did not establish care until 2016 and started intermittent ADT.  His PSA nadired to 0.15 in June 2020 but started to rise since that time.  His PSA on November 13, 2021 was 3.22 and PET showed peritoneal carcinomatosis.  He has been placed on AAP since 01/2022 and PSA has been <0.1.    Initial diagnosis: 2010 with a PSA of 10 and Gleason score 3+4=7 post radical prostatectomy (01/29/09) pT2c, pN0, cM0. 2012 rising PSA but failed to reestablish care until 2016 with a PSA up to 21.82. started intermittent ADT in 2016.  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 MSS Treatment: He is on Eligard  injection every 6 months here. Last received on 01/26/24. 01/09/22 Zytiga  1000 mg daily with prednisone  started.  His dose was reduced to 500 mg in May 2023 due to side effects.  04/2024 switch to darolutamide .   Assessment & Plan Prostate cancer (HCC) Continue darolutamide  600 mg twice daily Last leuprolide  at 6 mo dosage on 12/24/23. Due today. Return in 3 with lab and MD visit At risk for side effect of medication Supportive bone mineral density study Low bone mass (T-score -1.1 in 2022) Repeat this year He has dentures calcium (1000-1200 mg daily from food and supplements) and vitamin D3 (1000 IU daily) Control and prevent diabetes Aggressive cardiovascular risk management Weight-bearing exercises (30 minutes per day) Limit alcohol consumption and avoid smoking  No orders of the defined types were placed in this encounter.    Cody JAYSON Chihuahua, MD  INTERVAL HISTORY: Patient returns  for follow-up.  Oncology History Overview Cody [name]   Cancer Staging  Prostate cancer [facility]) Staging form: Prostate, AJCC 8th Edition - Clinical: Stage IVB (cTX, cNX, pM1, PSA: 10, Grade Group: 3) - Signed by Amadeo Windell SAILOR, MD on 01/03/2022 Prostate specific antigen (PSA) range: 10 to 19 Gleason score: 7 Histologic grading system: 5 grade system     Prostate cancer (HCC)  12/02/2021 PET scan   PSMA PET IMPRESSION: 1. Unusual pattern of intensely radiotracer avid peritoneal nodularity in the pelvis. Finding is most consistent with prostate cancer metastasis. If relevant, LEFT lower quadrant ventral peritoneal nodule may be accessible for biopsy. 2. Intense focus of radiotracer activity in the RIGHT hepatic lobe is concerning for hepatic metastasis. The linear pattern is somewhat unusual. Consider MRI with contrast for further evaluation. 3. Intense radiotracer avid presumed lymph node in the porta hepatis.   01/03/2022 Initial Diagnosis   Prostate cancer (HCC)   01/03/2022 Cancer Staging   Staging form: Prostate, AJCC 8th Edition - Clinical: Stage IVB (cTX, cNX, pM1, PSA: 10, Grade Group: 3) - Signed by Amadeo Windell SAILOR, MD on 01/03/2022 Prostate specific antigen (PSA) range: 10 to 19 Gleason score: 7 Histologic grading system: 5 grade system   01/2022 -  Chemotherapy   Started AAP   01/14/2024 PET scan   PET scan result.  There is much improvement from previously and resolution of some areas. Report of a focus of radiotracer activity remains in the region of the RIGHT prostatectomy bed. A focus of residual activity in the  LEFT ventral peritoneal space. an intense metabolic activity in the region of the porta hepatis. Discussed pros and cons of radiation. He is interested continuing the current course of treatment at this time to avoid more side effects. Continued on AAP and ADT and monitoring.      PHYSICAL EXAMINATION: ECOG PERFORMANCE STATUS: {CHL ONC ECOG PS:[id number]}  There  were no vitals filed for this visit. There were no vitals filed for this visit.  Relevant data reviewed during this visit included ***

## 2024-06-20 NOTE — Assessment & Plan Note (Addendum)
 Continue darolutamide  600 mg twice daily Last leuprolide  at 6 mo dosage on 12/24/23. Due today. Return in 3 with lab and MD visit

## 2024-06-21 ENCOUNTER — Telehealth: Payer: Self-pay

## 2024-06-21 ENCOUNTER — Inpatient Hospital Stay

## 2024-06-21 ENCOUNTER — Other Ambulatory Visit: Payer: Self-pay

## 2024-06-21 ENCOUNTER — Other Ambulatory Visit (HOSPITAL_COMMUNITY): Payer: Self-pay

## 2024-06-21 ENCOUNTER — Inpatient Hospital Stay: Attending: Hematology

## 2024-06-21 VITALS — BP 123/65 | HR 58 | Temp 97.0°F | Resp 20 | Wt 142.0 lb

## 2024-06-21 DIAGNOSIS — Z9189 Other specified personal risk factors, not elsewhere classified: Secondary | ICD-10-CM

## 2024-06-21 DIAGNOSIS — I159 Secondary hypertension, unspecified: Secondary | ICD-10-CM | POA: Diagnosis not present

## 2024-06-21 DIAGNOSIS — Z9079 Acquired absence of other genital organ(s): Secondary | ICD-10-CM | POA: Insufficient documentation

## 2024-06-21 DIAGNOSIS — C786 Secondary malignant neoplasm of retroperitoneum and peritoneum: Secondary | ICD-10-CM | POA: Insufficient documentation

## 2024-06-21 DIAGNOSIS — M85852 Other specified disorders of bone density and structure, left thigh: Secondary | ICD-10-CM | POA: Insufficient documentation

## 2024-06-21 DIAGNOSIS — C61 Malignant neoplasm of prostate: Secondary | ICD-10-CM

## 2024-06-21 DIAGNOSIS — E876 Hypokalemia: Secondary | ICD-10-CM | POA: Diagnosis not present

## 2024-06-21 DIAGNOSIS — Z79899 Other long term (current) drug therapy: Secondary | ICD-10-CM | POA: Diagnosis not present

## 2024-06-21 LAB — CBC WITH DIFFERENTIAL (CANCER CENTER ONLY)
Abs Immature Granulocytes: 0.01 K/uL (ref 0.00–0.07)
Basophils Absolute: 0.1 K/uL (ref 0.0–0.1)
Basophils Relative: 1 %
Eosinophils Absolute: 0.3 K/uL (ref 0.0–0.5)
Eosinophils Relative: 4 %
HCT: 33.2 % — ABNORMAL LOW (ref 39.0–52.0)
Hemoglobin: 11.6 g/dL — ABNORMAL LOW (ref 13.0–17.0)
Immature Granulocytes: 0 %
Lymphocytes Relative: 35 %
Lymphs Abs: 2.9 K/uL (ref 0.7–4.0)
MCH: 29.4 pg (ref 26.0–34.0)
MCHC: 34.9 g/dL (ref 30.0–36.0)
MCV: 84.1 fL (ref 80.0–100.0)
Monocytes Absolute: 0.6 K/uL (ref 0.1–1.0)
Monocytes Relative: 8 %
Neutro Abs: 4.3 K/uL (ref 1.7–7.7)
Neutrophils Relative %: 52 %
Platelet Count: 259 K/uL (ref 150–400)
RBC: 3.95 MIL/uL — ABNORMAL LOW (ref 4.22–5.81)
RDW: 12.6 % (ref 11.5–15.5)
WBC Count: 8.2 K/uL (ref 4.0–10.5)
nRBC: 0 % (ref 0.0–0.2)

## 2024-06-21 LAB — CMP (CANCER CENTER ONLY)
ALT: 10 U/L (ref 0–44)
AST: 19 U/L (ref 15–41)
Albumin: 4.5 g/dL (ref 3.5–5.0)
Alkaline Phosphatase: 76 U/L (ref 38–126)
Anion gap: 8 (ref 5–15)
BUN: 21 mg/dL (ref 8–23)
CO2: 30 mmol/L (ref 22–32)
Calcium: 9.4 mg/dL (ref 8.9–10.3)
Chloride: 100 mmol/L (ref 98–111)
Creatinine: 1.03 mg/dL (ref 0.61–1.24)
GFR, Estimated: >60 mL/min (ref >60)
Glucose, Bld: 126 mg/dL — ABNORMAL HIGH (ref 70–99)
Potassium: 2.8 mmol/L — ABNORMAL LOW (ref 3.5–5.1)
Sodium: 138 mmol/L (ref 135–145)
Total Bilirubin: 0.7 mg/dL (ref 0.0–1.2)
Total Protein: 7.3 g/dL (ref 6.5–8.1)

## 2024-06-21 MED ORDER — POTASSIUM CHLORIDE CRYS ER 10 MEQ PO TBCR: EXTENDED_RELEASE_TABLET | ORAL | 4 refills | Status: AC

## 2024-06-21 MED ORDER — RELUGOLIX 120 MG PO TABS
ORAL_TABLET | ORAL | 0 refills | Status: DC
  Filled 2024-06-21: qty 30, 28d supply, fill #0

## 2024-06-21 NOTE — Progress Notes (Signed)
 Specialty Pharmacy Initial Fill Coordination Note  Cody [name] is a 68 y.o. male contacted today regarding refills of specialty medication(s) Relugolix  (ORGOVYX ) .  Patient requested Delivery  on 06/23/24  to verified address [address]   [place] [address]   Medication will be filled on 06/22/24.   Patient is aware of $0.00 copayment.   Cody [name], CPhT [place]  [facility] Specialty Pharmacy Services Pharmacy Technician Patient Advocate Specialist II Cody [name] Phone: [phone]  Fax: [phone] [email or web address]

## 2024-06-21 NOTE — Telephone Encounter (Signed)
 Oral Chemotherapy Pharmacist Encounter  Patient Education I spoke with patient for overview of new oral chemotherapy medication: Orgovyx  for the treatment of metastatic prostate cancer castration-sensitive, planned duration until disease progression or unacceptable drug toxicity.   Treatment goal: Palliative  Counseled patient on administration, dosing, side effects, monitoring, drug-food interactions, safe handling, storage, and disposal.  Patient will take Orgovyx  120 mg tablets, 3 tablets (360mg  total) by mouth as loading dose on first day, and then 1 tablet (120mg  total) by mouth daily as maintenance thereafter.  Start date: 06/24/2024  Side effects include but not limited to: hot flash, fatigue, musculoskeletal pain.    Reviewed with patient importance of keeping a medication schedule and plan for any missed doses.  After discussion with patient, no patient barriers to medication adherence identified.   Distress thermometer flowsheet: Distress thermometer not completed during telephone call as patient has been on previous lines of therapy.    Communication and Learning Assessment Primary learner: Patient Barriers to learning: No barriers Preferred language: English Learning preferences: Listening Reading  Mr. Roarty voiced understanding and appreciation. All questions answered. Medication handout provided.  Provided patient with Oral Chemotherapy Navigation Clinic phone number. Patient knows to call the office with questions or concerns.  Leonor Argyle, PharmD PGY1 06/21/2024 2:00 PM Oral Oncology Clinic [phone]

## 2024-06-21 NOTE — Progress Notes (Signed)
 Patient counseled on Orgovyx  in PGY1 pharmacist encounter opened by [facility], [facility]. Encounter opened on 06/21/2024.  Charisa Twitty, PharmD Hematology/Oncology Clinical Pharmacist [place] Oral Chemotherapy Navigation Clinic [phone]

## 2024-06-21 NOTE — Telephone Encounter (Signed)
 Oral Oncology Pharmacist Encounter  Received new prescription for Orgovyx  (relugolix ) for the treatment of metastatic prostate cancer castration-sensitive in conjunction with Nubeqa , planned duration until disease progression or unacceptable drug toxicity.  Patient switching from Eligard  (last dose 01/26/24) to Orgovyx .  Labs from 9/16 assessed, CBC w/ diff and CMP. No baseline dose adjustments required for Orgovyx  at this time. Noted potassium 2.8, but patient has home potassium regimen.  Prescription dose and frequency assessed for appropriateness.  Current medication list in Epic reviewed, no relevant/significant DDIs with Orgovyx  identified.   Evaluated chart and no patient barriers to medication adherence noted.   Patient agreement for treatment documented in MD note on 06/21/2024.  Prescription has been e-scribed to the [facility] for benefits analysis and approval.  Oral Oncology Clinic will continue to follow for insurance authorization, copayment issues, initial counseling and start date.  Leonor Argyle, PharmD PGY1 06/21/2024 9:33 AM Oral Oncology Clinic [phone]

## 2024-06-21 NOTE — Assessment & Plan Note (Addendum)
 Take 2 tabs twice a day for 3 days, then 2 tabs once daily. Return on10/28 with lab and MD visit

## 2024-06-21 NOTE — Telephone Encounter (Signed)
 Oral Oncology Patient Advocate Encounter  After completing a benefits investigation, prior authorization for relugolix  (ORGOVYX ) 120 MG tablet  is not required at this time through [facility].  Patient's copay is $0.00.     Lucie Lamer, CPhT [place]  [facility] Specialty Pharmacy Services Pharmacy Technician Patient Advocate Specialist II THERESSA [place] Phone: [phone]  Fax: [phone] [email or web address]

## 2024-06-21 NOTE — Patient Instructions (Addendum)
 Start Potassium 20 mEq (2 tabs of 10 mEq each) twice daily for 3 days, then 2 tabs once daily afterwards.  Talk to your dentist about osteoporosis medicine Zometa if they have any concerns on starting.  Referral to get genetic testing placed. You can schedule with them when they call you.  Will stop the shot and trial of new pill, Orgovyx  to replace the shot.  Continue Nubeqa  600 mg twice daily.  Bone density to be scheduled.

## 2024-06-22 ENCOUNTER — Other Ambulatory Visit: Payer: Self-pay

## 2024-06-22 ENCOUNTER — Ambulatory Visit: Payer: Self-pay

## 2024-06-22 LAB — TESTOSTERONE: Testosterone: <3 ng/dL — ABNORMAL LOW (ref 264–916)

## 2024-06-22 LAB — PROSTATE-SPECIFIC AG, SERUM (LABCORP): Prostate Specific Ag, Serum: <0.1 ng/mL (ref 0.0–4.0)

## 2024-06-23 NOTE — Progress Notes (Signed)
 Contacted pt per Dr Tina to : let him know good news, PSA remains less than 0.1  Pt acknowledged information and verbalized understanding.

## 2024-06-24 ENCOUNTER — Telehealth: Payer: Self-pay

## 2024-06-24 NOTE — Telephone Encounter (Signed)
 Scheduled patient appointments. Called and left the patient a voicemail with appointment details.

## 2024-06-30 ENCOUNTER — Other Ambulatory Visit: Payer: Self-pay

## 2024-07-01 ENCOUNTER — Other Ambulatory Visit: Payer: Self-pay

## 2024-07-01 NOTE — Progress Notes (Signed)
 Specialty Pharmacy Refill Coordination Note  Cody [name] is a 68 y.o. male contacted today regarding refills of specialty medication(s) Darolutamide  (NUBEQA )   Patient requested Delivery   Delivery date: 07/01/24   Verified address: [address]   [place] [address]   Medication will be filled on 07/01/24.

## 2024-07-05 NOTE — Progress Notes (Signed)
 RN spoke with patient to follow up with any additional questions or new barriers since change in treatment with Dr. Tina.  Patient denies any at this time.   Patient is established with a treatment plan and is actively engaged in care. Nurse Navigator services not currently indicated at this time. Will re-evaluate if needs change or if additional support is requested.

## 2024-07-12 ENCOUNTER — Other Ambulatory Visit: Payer: Self-pay

## 2024-07-12 DIAGNOSIS — C61 Malignant neoplasm of prostate: Secondary | ICD-10-CM

## 2024-07-12 MED ORDER — ORGOVYX 120 MG PO TABS
120.0000 mg | ORAL_TABLET | Freq: Every day | ORAL | 0 refills | Status: DC
  Filled 2024-07-12: qty 30, 30d supply, fill #0

## 2024-07-12 NOTE — Progress Notes (Signed)
 Specialty Pharmacy Refill Coordination Note  Cody [name] is a 68 y.o. male contacted today regarding refills of specialty medication(s) Relugolix  (ORGOVYX )   Patient requested Delivery   Delivery date: 07/14/24   Verified address: [address]   [place] [address]   Medication will be filled on 07/13/24. This fill date is pending response to refill request from provider. Patient is aware and if they have not received fill by intended date they must follow up with pharmacy.

## 2024-07-12 NOTE — Progress Notes (Signed)
 Specialty Pharmacy Ongoing Clinical Assessment Note  Cody [name] is a 68 y.o. male who is being followed by the specialty pharmacy service for RxSp Oncology   Patient's specialty medication(s) reviewed today: Darolutamide  (NUBEQA ); Relugolix  (ORGOVYX )   Missed doses in the last 4 weeks: 0   Patient/Caregiver did not have any additional questions or concerns.   Therapeutic benefit summary: Patient is achieving benefit   Adverse events/side effects summary: No adverse events/side effects   Patient's therapy is appropriate to: Continue    Goals Addressed             This Visit's Progress    Maintain optimal adherence to therapy   On track    Patient is on track. Patient will maintain adherence.          Follow up: 3 months  [facility]

## 2024-07-13 ENCOUNTER — Other Ambulatory Visit: Payer: Self-pay

## 2024-07-22 ENCOUNTER — Other Ambulatory Visit: Payer: Self-pay | Admitting: Medical Genetics

## 2024-07-22 ENCOUNTER — Other Ambulatory Visit: Payer: Self-pay

## 2024-07-22 DIAGNOSIS — Z006 Encounter for examination for normal comparison and control in clinical research program: Secondary | ICD-10-CM

## 2024-07-25 ENCOUNTER — Other Ambulatory Visit: Payer: Self-pay

## 2024-07-26 ENCOUNTER — Other Ambulatory Visit (HOSPITAL_COMMUNITY): Payer: Self-pay

## 2024-07-26 ENCOUNTER — Other Ambulatory Visit: Payer: Self-pay

## 2024-07-26 NOTE — Progress Notes (Signed)
 Specialty Pharmacy Refill Coordination Note  Cody [name] is a 68 y.o. male contacted today regarding refills of specialty medication(s) Darolutamide  (NUBEQA )   Patient requested Delivery   Delivery date: 07/29/24   Verified address: [address]   [place] [address]   Medication will be filled on 10.23.25.

## 2024-07-28 ENCOUNTER — Other Ambulatory Visit: Payer: Self-pay

## 2024-07-28 ENCOUNTER — Other Ambulatory Visit (HOSPITAL_COMMUNITY): Payer: Self-pay

## 2024-08-04 ENCOUNTER — Other Ambulatory Visit (HOSPITAL_COMMUNITY): Payer: Self-pay

## 2024-08-05 ENCOUNTER — Other Ambulatory Visit: Payer: Self-pay

## 2024-08-05 ENCOUNTER — Encounter (INDEPENDENT_AMBULATORY_CARE_PROVIDER_SITE_OTHER): Payer: Self-pay

## 2024-08-05 ENCOUNTER — Other Ambulatory Visit (HOSPITAL_COMMUNITY): Payer: Self-pay

## 2024-08-05 DIAGNOSIS — C61 Malignant neoplasm of prostate: Secondary | ICD-10-CM

## 2024-08-05 MED ORDER — ORGOVYX 120 MG PO TABS
120.0000 mg | ORAL_TABLET | Freq: Every day | ORAL | 0 refills | Status: DC
  Filled 2024-08-05 – 2024-08-09 (×2): qty 30, 30d supply, fill #0

## 2024-08-09 ENCOUNTER — Other Ambulatory Visit: Payer: Self-pay

## 2024-08-09 NOTE — Progress Notes (Signed)
 Specialty Pharmacy Refill Coordination Note  Cody [name] is a 68 y.o. male contacted today regarding refills of specialty medication(s) Relugolix  (Orgovyx )   Patient requested Delivery   Delivery date: 08/12/24   Verified address: [address] [place] [address]   Medication will be filled on: 08/11/24

## 2024-08-10 ENCOUNTER — Other Ambulatory Visit: Payer: Self-pay

## 2024-08-18 ENCOUNTER — Other Ambulatory Visit: Payer: Self-pay

## 2024-08-19 ENCOUNTER — Other Ambulatory Visit: Payer: Self-pay

## 2024-08-23 ENCOUNTER — Other Ambulatory Visit (HOSPITAL_COMMUNITY): Payer: Self-pay

## 2024-08-23 ENCOUNTER — Other Ambulatory Visit: Payer: Self-pay

## 2024-08-23 LAB — GENECONNECT MOLECULAR SCREEN

## 2024-08-23 NOTE — Progress Notes (Signed)
 Specialty Pharmacy Refill Coordination Note  Spoke with Cody [name]  Cody [name] is a 69 y.o. male contacted today regarding refills of specialty medication(s) Darolutamide  (NUBEQA )  Doses on hand: Roughly 7 (28 tabs)  Patient requested: Delivery   Delivery date: 08/29/24   Verified address: [address] [place] [address]  Medication will be filled on 08/26/24 .

## 2024-08-25 ENCOUNTER — Other Ambulatory Visit: Payer: Self-pay

## 2024-08-28 NOTE — Assessment & Plan Note (Signed)
 Take 2 tabs twice a day for 3 days, then 2 tabs once daily. Return on10/28 with lab and MD visit

## 2024-08-28 NOTE — Progress Notes (Unsigned)
 [facility] OFFICE PROGRESS NOTE  Patient Care Team: Health, [address] as PCP - General  Cody [name] is a 68 y.o.male with history of HTN, PVC being seen at Medical Oncology Clinic for prostate cancer. He was initially found to have prostate cancer in 2010 underwent RP with final pathology of pT2cN0. He had rising PSA in 2012 and did not establish care until 2016 and started intermittent ADT.  His PSA nadired to 0.15 in June 2020 but started to rise since that time.  His PSA on November 13, 2021 was 3.22 and PET showed peritoneal carcinomatosis.  He has been placed on AAP since 01/2022 and PSA has been <0.1. switched to darolutamide  with persistent bothersome side effects.   Initial diagnosis: 2010 with a PSA of 10 and Gleason score 3+4=7 post radical prostatectomy (01/29/09) pT2c, pN0, cM0. 2012 rising PSA but failed to reestablish care until 2016 with a PSA up to 21.82. started intermittent ADT in 2016.  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 MSS Treatment: He is on Eligard  injection every 6 months here. Last received on 01/26/24. 01/09/22 Zytiga  1000 mg daily with prednisone  started.  His dose was reduced to 500 mg in May 2023 due to side effects.  04/2024 switch to darolutamide .  Persistent sweating every day without improvement.  This has become bothersome. Assessment & Plan   No orders of the defined types were placed in this encounter.    Cody JAYSON Chihuahua, MD  INTERVAL HISTORY: Patient returns for follow-up.  Oncology History Overview Note   Cancer Staging  Prostate cancer [facility]) Staging form: Prostate, AJCC 8th Edition - Clinical: Stage IVB (cTX, cNX, pM1, PSA: 10, Grade Group: 3) - Signed by Amadeo Windell SAILOR, MD on 01/03/2022 Prostate specific antigen (PSA) range: 10 to 19 Gleason score: 7 Histologic grading system: 5 grade system     Prostate cancer (HCC)  12/02/2021 PET scan    PSMA PET IMPRESSION: 1. Unusual pattern of intensely radiotracer avid peritoneal nodularity in the pelvis. Finding is most consistent with prostate cancer metastasis. If relevant, LEFT lower quadrant ventral peritoneal nodule may be accessible for biopsy. 2. Intense focus of radiotracer activity in the RIGHT hepatic lobe is concerning for hepatic metastasis. The linear pattern is somewhat unusual. Consider MRI with contrast for further evaluation. 3. Intense radiotracer avid presumed lymph node in the porta hepatis.   01/03/2022 Initial Diagnosis   Prostate cancer (HCC)   01/03/2022 Cancer Staging   Staging form: Prostate, AJCC 8th Edition - Clinical: Stage IVB (cTX, cNX, pM1, PSA: 10, Grade Group: 3) - Signed by Amadeo Windell SAILOR, MD on 01/03/2022 Prostate specific antigen (PSA) range: 10 to 19 Gleason score: 7 Histologic grading system: 5 grade system   01/2022 -  Chemotherapy   Started AAP   01/14/2024 PET scan   PET scan result.  There is much improvement from previously and resolution of some areas. Report of a focus of radiotracer activity remains in the region of the RIGHT prostatectomy bed. A focus of residual activity in the LEFT ventral peritoneal space. an intense metabolic activity in the region of the porta hepatis. Discussed pros and cons of radiation. He is interested continuing the current course of treatment at this time to avoid more side effects. Continued on AAP and ADT and monitoring.      PHYSICAL EXAMINATION: ECOG PERFORMANCE STATUS: {CHL ONC ECOG PS:[phone]}  There were no vitals filed for this  visit. There were no vitals filed for this visit.  GENERAL: alert, no distress and comfortable SKIN: skin color normal and no jaundice or bruising or petechiae on exposed skin EYES: normal, sclera clear OROPHARYNX: no exudate  NECK: No palpable mass LYMPH:  no palpable cervical, axillary lymphadenopathy  LUNGS: clear to auscultation and no wheeze or rales with  normal breathing effort HEART: regular rate & rhythm  ABDOMEN: abdomen soft, non-tender and nondistended. Musculoskeletal: no edema NEURO: no focal motor/sensory deficits  Relevant data reviewed during this visit included labs.  New labs ordered.

## 2024-08-29 ENCOUNTER — Telehealth: Payer: Self-pay | Admitting: Medical Genetics

## 2024-08-29 DIAGNOSIS — Z006 Encounter for examination for normal comparison and control in clinical research program: Secondary | ICD-10-CM

## 2024-08-30 ENCOUNTER — Inpatient Hospital Stay: Attending: Hematology

## 2024-08-30 ENCOUNTER — Inpatient Hospital Stay

## 2024-08-30 ENCOUNTER — Other Ambulatory Visit: Payer: Self-pay

## 2024-08-30 DIAGNOSIS — C786 Secondary malignant neoplasm of retroperitoneum and peritoneum: Secondary | ICD-10-CM

## 2024-08-30 DIAGNOSIS — E876 Hypokalemia: Secondary | ICD-10-CM | POA: Diagnosis present

## 2024-08-30 DIAGNOSIS — Z79899 Other long term (current) drug therapy: Secondary | ICD-10-CM | POA: Diagnosis not present

## 2024-08-30 DIAGNOSIS — I159 Secondary hypertension, unspecified: Secondary | ICD-10-CM | POA: Diagnosis not present

## 2024-08-30 DIAGNOSIS — C61 Malignant neoplasm of prostate: Secondary | ICD-10-CM | POA: Insufficient documentation

## 2024-08-30 DIAGNOSIS — Z9189 Other specified personal risk factors, not elsewhere classified: Secondary | ICD-10-CM

## 2024-08-30 LAB — BASIC METABOLIC PANEL - CANCER CENTER ONLY
Anion gap: 9 (ref 5–15)
BUN: 24 mg/dL — ABNORMAL HIGH (ref 8–23)
CO2: 28 mmol/L (ref 22–32)
Calcium: 10 mg/dL (ref 8.9–10.3)
Chloride: 99 mmol/L (ref 98–111)
Creatinine: 0.93 mg/dL (ref 0.61–1.24)
GFR, Estimated: >60 mL/min (ref >60)
Glucose, Bld: 133 mg/dL — ABNORMAL HIGH (ref 70–99)
Potassium: 4.5 mmol/L (ref 3.5–5.1)
Sodium: 136 mmol/L (ref 135–145)

## 2024-08-30 NOTE — Telephone Encounter (Signed)
 [facility]  08/30/2024 3:56 PM  Confirmed I was speaking with STCLAIR SZYMBORSKI [phone] by using name and DOB. Informed participant the reason for this call is to follow-up on a recent sample the participant provided at one of the [facility] lab locations. Informed participant the test was not able to be completed with this sample and apologized for the inconvenience. Participant was requested to provide a new sample at one of our participating labs at no cost so that participant can continue participation and receive test results. Informed participant they do not need to be fasting and if there are other samples that need to be drawn, they can be done at the same visit. Participant has not had a blood transfusion or blood product in the last 30 days. Participant agreed to provide another sample. Participant was provided the [employer] program website to learn why this may have happened. Participant was thanked for their time and continued support of the above study.    Jordyn Pennstrom, BS [place]  [facility] Clinical Research Specialist II Direct Dial: [phone]  Fax: [phone]

## 2024-08-30 NOTE — Assessment & Plan Note (Addendum)
 Continue darolutamide  600 mg twice daily Last leuprolide  at 6 mo dosage on 12/24/23.  Will stop leuprolide  and changed to Orgovyx . ECG with next visit Return on 10/28 with lab and MD visit Referral for genetic testing please

## 2024-08-30 NOTE — Assessment & Plan Note (Addendum)
 On losartan-hydrochlorothiazide 100-25 mg daily Continue to monitor closely at home.

## 2024-08-30 NOTE — Patient Instructions (Signed)
 Continue darolutamide  600 mg twice daily and Orgovyx  daily Stop potassium Continue vitamin D and calcium Repeat lab next month

## 2024-08-30 NOTE — Assessment & Plan Note (Addendum)
 Supportive bone mineral density study Low bone mass (L Femur T-score -1.1 in 2022) Will repeat bone density Recommend start Zometa every 6 months after dental clearance Repeat this year He has dentures, few teeth left at bottom calcium (1000-1200 mg daily from food and supplements) and vitamin D3 (1000 IU daily) Control and prevent diabetes Aggressive cardiovascular risk management Weight-bearing exercises (30 minutes per day) Limit alcohol consumption and avoid smoking

## 2024-08-31 ENCOUNTER — Other Ambulatory Visit: Payer: Self-pay

## 2024-08-31 DIAGNOSIS — C61 Malignant neoplasm of prostate: Secondary | ICD-10-CM

## 2024-09-02 ENCOUNTER — Other Ambulatory Visit: Payer: Self-pay

## 2024-09-02 DIAGNOSIS — C61 Malignant neoplasm of prostate: Secondary | ICD-10-CM

## 2024-09-05 ENCOUNTER — Telehealth: Payer: Self-pay | Admitting: *Deleted

## 2024-09-05 ENCOUNTER — Other Ambulatory Visit: Payer: Self-pay

## 2024-09-05 ENCOUNTER — Other Ambulatory Visit: Payer: Self-pay | Admitting: Pharmacy Technician

## 2024-09-05 ENCOUNTER — Other Ambulatory Visit (HOSPITAL_COMMUNITY): Payer: Self-pay

## 2024-09-05 DIAGNOSIS — C61 Malignant neoplasm of prostate: Secondary | ICD-10-CM

## 2024-09-05 MED ORDER — ORGOVYX 120 MG PO TABS
120.0000 mg | ORAL_TABLET | Freq: Every day | ORAL | 0 refills | Status: DC
  Filled 2024-09-05: qty 30, 30d supply, fill #0

## 2024-09-05 NOTE — Telephone Encounter (Signed)
 Cody [name] states he went to [facility] and they do not give dental clearance for medication administration. He does not have a regular dentist as he has a full set of dentures.

## 2024-09-05 NOTE — Progress Notes (Signed)
 Specialty Pharmacy Refill Coordination Note  Cody [name] is a 68 y.o. male contacted today regarding refills of specialty medication(s) Orgovyx   Patient requested (Patient-Rptd) Delivery   Delivery date: 09/09/2024 Verified address: (Patient-Rptd) [address] [place] [address]   Medication will be filled on: 09/08/2024

## 2024-09-06 ENCOUNTER — Inpatient Hospital Stay

## 2024-09-07 ENCOUNTER — Other Ambulatory Visit: Payer: Self-pay

## 2024-09-08 ENCOUNTER — Other Ambulatory Visit: Payer: Self-pay

## 2024-09-16 ENCOUNTER — Other Ambulatory Visit: Payer: Self-pay

## 2024-09-16 ENCOUNTER — Other Ambulatory Visit (HOSPITAL_COMMUNITY): Payer: Self-pay

## 2024-09-16 NOTE — Progress Notes (Signed)
 Specialty Pharmacy Refill Coordination Note  MyChart Questionnaire Submission  Cody [name] is a 68 y.o. male contacted today regarding refills of specialty medication(s) Nubeqa .  Doses on hand: Patient reports having half of a bottle. Should have about 15 days (60 tabs)  Patient requested: (Patient-Rptd) Delivery   Delivery date: 09/28/24  Verified address: [address] [place] [address]  Medication will be filled on 09/27/24

## 2024-09-23 ENCOUNTER — Other Ambulatory Visit: Payer: Self-pay

## 2024-09-23 NOTE — Progress Notes (Signed)
 Specialty Pharmacy Ongoing Clinical Assessment Note  Cody [name] is a 68 y.o. male who is being followed by the specialty pharmacy service for RxSp Oncology   Patient's specialty medication(s) reviewed today: Darolutamide  (NUBEQA ); Relugolix  (Orgovyx )   Missed doses in the last 4 weeks: 0   Patient/Caregiver did not have any additional questions or concerns.   Therapeutic benefit summary: Patient is achieving benefit   Adverse events/side effects summary: No adverse events/side effects   Patient's therapy is appropriate to: Continue    Goals Addressed             This Visit's Progress    Maintain optimal adherence to therapy   On track    Patient is on track. Patient will maintain adherence.          Follow up: 3 months  [facility]

## 2024-09-25 NOTE — Progress Notes (Unsigned)
 [facility] OFFICE PROGRESS NOTE  Patient Care Team: Health, [address] as PCP - General  Cody [name] is a 68 y.o.male with history of HTN, PVC being [facility] cancer. He was initially found to have prostate cancer in 2010 underwent RP with final pathology of pT2cN0. He had rising PSA in 2012 and did not establish care until 2016 and started intermittent ADT. He had rising PSA in 2012 and did not establish care until 2016 and started intermittent ADT.  His PSA nadired to 0.15 in June 2020 but started to rise since that time.  His PSA on November 13, 2021 was 3.22 and PET showed peritoneal carcinomatosis. He has been placed on AAP since 01/2022 and PSA has been <0.1 since 06/2023. switched to darolutamide  due to persistent bothersome side effects. Switched from injection to Orgovyx  with improvement of appetite and energy.   Initial diagnosis: 2010 with a PSA of 10 and Gleason score 3+4=7 post radical prostatectomy (01/29/09) pT2c, pN0, cM0. 2012 rising PSA but failed to reestablish care until 2016 with a PSA up to 21.82. started intermittent ADT in 2016.  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 MSS Treatment: He is on Eligard  injection every 6 months here. Last received on 01/26/24. 01/09/22 Zytiga  1000 mg daily with prednisone  started.  His dose was reduced to 500 mg in May 2023 due to side effects.  04/2024 switch to darolutamide .  Persistent sweating every day without improvement.  This has become bothersome. 06/2024 switched to orgovyx . Energy and appetite improved  Discussed dental evaluation as he has several teeth left. Consider osteoporosis medicine in the near future if worsening bone density.   Potassium was discontinued and repeat testing today showed Assessment & Plan Prostate cancer [facility]) PSA has been <0.1 since  06/2023 Continue darolutamide  600 mg twice daily Change to Orgovyx  in 06/2024 ECG was normal and Qtc 459 Primary hypertension On losartan-hydrochlorothiazide 100-25 mg daily Continue to monitor closely at home. At risk for side effect of medication Supportive bone mineral density study Low bone mass (L Femur T-score -1.1 in 2022) Will repeat bone density Recommend start Zometa every 6 months after dental clearance Repeat this year He has dentures, few teeth left at bottom calcium (1000-1200 mg daily from food and supplements) and vitamin D3 (1000 IU daily) Control and prevent diabetes Aggressive cardiovascular risk management Weight-bearing exercises (30 minutes per day) Limit alcohol consumption and avoid smoking Normocytic anemia Mcv slightly lower. Will check ferritin and iron panel Hypokalemia Return on 2/2 with labs.  No orders of the defined types were placed in this encounter.    Cody JAYSON Chihuahua, MD  INTERVAL HISTORY: Patient returns for follow-up.  Oncology History Overview Note   Cancer Staging  Prostate cancer [facility]) Staging form: Prostate, AJCC 8th Edition - Clinical: Stage IVB (cTX, cNX, pM1, PSA: 10, Grade Group: 3) - Signed by Amadeo Windell SAILOR, MD on 01/03/2022 Prostate specific antigen (PSA) range: 10 to 19 Gleason score: 7 Histologic grading system: 5 grade system     Prostate cancer (HCC)  12/02/2021 PET scan   PSMA PET IMPRESSION: 1. Unusual pattern of intensely radiotracer avid peritoneal nodularity in the pelvis. Finding is most consistent with prostate cancer metastasis. If relevant, LEFT lower quadrant ventral peritoneal nodule may be accessible for biopsy. 2. Intense focus of radiotracer activity in the RIGHT hepatic lobe is concerning for  hepatic metastasis. The linear pattern is somewhat unusual. Consider MRI with contrast for further evaluation. 3. Intense radiotracer avid presumed lymph node in the porta hepatis.   01/03/2022 Initial  Diagnosis   Prostate cancer (HCC)   01/03/2022 Cancer Staging   Staging form: Prostate, AJCC 8th Edition - Clinical: Stage IVB (cTX, cNX, pM1, PSA: 10, Grade Group: 3) - Signed by Amadeo Windell SAILOR, MD on 01/03/2022 Prostate specific antigen (PSA) range: 10 to 19 Gleason score: 7 Histologic grading system: 5 grade system   01/2022 -  Chemotherapy   Started AAP   01/14/2024 PET scan   PET scan result.  There is much improvement from previously and resolution of some areas. Report of a focus of radiotracer activity remains in the region of the RIGHT prostatectomy bed. A focus of residual activity in the LEFT ventral peritoneal space. an intense metabolic activity in the region of the porta hepatis. Discussed pros and cons of radiation. He is interested continuing the current course of treatment at this time to avoid more side effects. Continued on AAP and ADT and monitoring.      PHYSICAL EXAMINATION: ECOG PERFORMANCE STATUS: {CHL ONC ECOG ED:[id number]}  Vitals:   09/26/24 1531  BP: 114/65  Pulse: 65  Resp: 20  Temp: (!) 97.3 F (36.3 C)  SpO2: 100%   Filed Weights   09/26/24 1531  Weight: 142 lb 12.8 oz (64.8 kg)    GENERAL: alert, no distress and comfortable SKIN: skin color normal and no jaundice or bruising or petechiae on exposed skin EYES: normal, sclera clear OROPHARYNX: no exudate  NECK: No palpable mass LYMPH:  no palpable cervical, axillary lymphadenopathy  LUNGS: clear to auscultation and no wheeze or rales with normal breathing effort HEART: regular rate & rhythm  ABDOMEN: abdomen soft, non-tender and nondistended. Musculoskeletal: no edema NEURO: no focal motor/sensory deficits  Relevant data reviewed during this visit included labs.  New labs ordered.

## 2024-09-26 ENCOUNTER — Other Ambulatory Visit: Payer: Self-pay

## 2024-09-26 ENCOUNTER — Inpatient Hospital Stay

## 2024-09-26 ENCOUNTER — Inpatient Hospital Stay: Attending: Hematology

## 2024-09-26 VITALS — BP 114/65 | HR 65 | Temp 97.3°F | Resp 20 | Wt 142.8 lb

## 2024-09-26 DIAGNOSIS — C61 Malignant neoplasm of prostate: Secondary | ICD-10-CM | POA: Diagnosis not present

## 2024-09-26 DIAGNOSIS — I10 Essential (primary) hypertension: Secondary | ICD-10-CM | POA: Diagnosis not present

## 2024-09-26 DIAGNOSIS — I159 Secondary hypertension, unspecified: Secondary | ICD-10-CM | POA: Diagnosis not present

## 2024-09-26 DIAGNOSIS — E876 Hypokalemia: Secondary | ICD-10-CM

## 2024-09-26 DIAGNOSIS — D649 Anemia, unspecified: Secondary | ICD-10-CM

## 2024-09-26 DIAGNOSIS — Z79899 Other long term (current) drug therapy: Secondary | ICD-10-CM | POA: Diagnosis not present

## 2024-09-26 DIAGNOSIS — Z9079 Acquired absence of other genital organ(s): Secondary | ICD-10-CM | POA: Diagnosis not present

## 2024-09-26 DIAGNOSIS — C786 Secondary malignant neoplasm of retroperitoneum and peritoneum: Secondary | ICD-10-CM | POA: Diagnosis present

## 2024-09-26 DIAGNOSIS — Z9189 Other specified personal risk factors, not elsewhere classified: Secondary | ICD-10-CM

## 2024-09-26 LAB — CMP (CANCER CENTER ONLY)
ALT: 12 U/L (ref 0–44)
AST: 21 U/L (ref 15–41)
Albumin: 4.5 g/dL (ref 3.5–5.0)
Alkaline Phosphatase: 86 U/L (ref 38–126)
Anion gap: 10 (ref 5–15)
BUN: 25 mg/dL — ABNORMAL HIGH (ref 8–23)
CO2: 28 mmol/L (ref 22–32)
Calcium: 9.5 mg/dL (ref 8.9–10.3)
Chloride: 98 mmol/L (ref 98–111)
Creatinine: 1.04 mg/dL (ref 0.61–1.24)
GFR, Estimated: >60 mL/min
Glucose, Bld: 96 mg/dL (ref 70–99)
Potassium: 3.6 mmol/L (ref 3.5–5.1)
Sodium: 136 mmol/L (ref 135–145)
Total Bilirubin: 0.3 mg/dL (ref 0.0–1.2)
Total Protein: 7.3 g/dL (ref 6.5–8.1)

## 2024-09-26 LAB — FERRITIN: Ferritin: 198 ng/mL (ref 24–336)

## 2024-09-26 LAB — CBC WITH DIFFERENTIAL (CANCER CENTER ONLY)
Abs Immature Granulocytes: 0.02 K/uL (ref 0.00–0.07)
Basophils Absolute: 0.1 K/uL (ref 0.0–0.1)
Basophils Relative: 1 %
Eosinophils Absolute: 0.3 K/uL (ref 0.0–0.5)
Eosinophils Relative: 4 %
HCT: 32.7 % — ABNORMAL LOW (ref 39.0–52.0)
Hemoglobin: 11.2 g/dL — ABNORMAL LOW (ref 13.0–17.0)
Immature Granulocytes: 0 %
Lymphocytes Relative: 33 %
Lymphs Abs: 3 K/uL (ref 0.7–4.0)
MCH: 29.6 pg (ref 26.0–34.0)
MCHC: 34.3 g/dL (ref 30.0–36.0)
MCV: 86.5 fL (ref 80.0–100.0)
Monocytes Absolute: 0.9 K/uL (ref 0.1–1.0)
Monocytes Relative: 10 %
Neutro Abs: 4.7 K/uL (ref 1.7–7.7)
Neutrophils Relative %: 52 %
Platelet Count: 254 K/uL (ref 150–400)
RBC: 3.78 MIL/uL — ABNORMAL LOW (ref 4.22–5.81)
RDW: 12.7 % (ref 11.5–15.5)
WBC Count: 9 K/uL (ref 4.0–10.5)
nRBC: 0 % (ref 0.0–0.2)

## 2024-09-26 LAB — PSA: Prostatic Specific Antigen: 0.08 ng/mL (ref 0.00–4.00)

## 2024-09-26 LAB — IRON AND IRON BINDING CAPACITY (CC-WL,HP ONLY)
Iron: 41 ug/dL — ABNORMAL LOW (ref 45–182)
Saturation Ratios: 10 % — ABNORMAL LOW (ref 17.9–39.5)
TIBC: 395 ug/dL (ref 250–450)
UIBC: 354 ug/dL

## 2024-09-26 NOTE — Assessment & Plan Note (Addendum)
 Mcv slightly lower. Will check ferritin and iron panel

## 2024-09-26 NOTE — Assessment & Plan Note (Addendum)
 Supportive bone mineral density study Low bone mass (L Femur T-score -1.1 in 2022) Will repeat bone density Recommend start Zometa every 6 months after dental clearance Repeat this year He has dentures, few teeth left at bottom calcium (1000-1200 mg daily from food and supplements) and vitamin D3 (1000 IU daily) Control and prevent diabetes Aggressive cardiovascular risk management Weight-bearing exercises (30 minutes per day) Limit alcohol consumption and avoid smoking

## 2024-09-26 NOTE — Assessment & Plan Note (Addendum)
 Return on 2/2 with labs.

## 2024-09-26 NOTE — Assessment & Plan Note (Addendum)
 On losartan-hydrochlorothiazide 100-25 mg daily Continue to monitor closely at home.

## 2024-09-26 NOTE — Assessment & Plan Note (Addendum)
 PSA has been <0.1 since 06/2023 Continue darolutamide  600 mg twice daily Change to Orgovyx  in 06/2024 ECG was normal and Qtc 459

## 2024-09-27 ENCOUNTER — Encounter: Payer: Self-pay | Admitting: Hematology

## 2024-09-27 LAB — TESTOSTERONE: Testosterone: <3 ng/dL — ABNORMAL LOW (ref 264–916)

## 2024-09-28 ENCOUNTER — Other Ambulatory Visit: Payer: Self-pay | Admitting: Pharmacy Technician

## 2024-09-28 ENCOUNTER — Other Ambulatory Visit: Payer: Self-pay

## 2024-09-28 DIAGNOSIS — C61 Malignant neoplasm of prostate: Secondary | ICD-10-CM

## 2024-09-28 MED ORDER — ORGOVYX 120 MG PO TABS
120.0000 mg | ORAL_TABLET | Freq: Every day | ORAL | 0 refills | Status: DC
  Filled 2024-09-30 – 2024-10-07 (×3): qty 30, 30d supply, fill #0

## 2024-09-30 ENCOUNTER — Other Ambulatory Visit (HOSPITAL_COMMUNITY): Payer: Self-pay

## 2024-09-30 ENCOUNTER — Other Ambulatory Visit: Payer: Self-pay

## 2024-10-07 ENCOUNTER — Encounter: Payer: Self-pay | Admitting: Hematology

## 2024-10-07 ENCOUNTER — Other Ambulatory Visit: Payer: Self-pay

## 2024-10-07 ENCOUNTER — Other Ambulatory Visit (HOSPITAL_COMMUNITY): Payer: Self-pay

## 2024-10-10 ENCOUNTER — Other Ambulatory Visit: Payer: Self-pay

## 2024-10-10 NOTE — Progress Notes (Signed)
 Specialty Pharmacy Refill Coordination Note  Cody [name] is a 69 y.o. male contacted today regarding refills of specialty medication(s) Relugolix  (Orgovyx )   Patient requested Delivery   Delivery date: 10/13/24   Verified address: Patient address [address]  [place] [address]   Medication will be filled on: 10/12/24

## 2024-10-11 ENCOUNTER — Other Ambulatory Visit: Payer: Self-pay | Admitting: Medical Genetics

## 2024-10-11 DIAGNOSIS — Z006 Encounter for examination for normal comparison and control in clinical research program: Secondary | ICD-10-CM

## 2024-10-19 ENCOUNTER — Other Ambulatory Visit: Payer: Self-pay

## 2024-10-20 ENCOUNTER — Other Ambulatory Visit (HOSPITAL_COMMUNITY): Payer: Self-pay

## 2024-10-20 ENCOUNTER — Other Ambulatory Visit: Payer: Self-pay

## 2024-10-20 NOTE — Progress Notes (Signed)
 Specialty Pharmacy Refill Coordination Note  MyChart Questionnaire Submission  Cody [name] is a 69 y.o. male contacted today regarding refills of specialty medication(s) Nuneqa  Doses on hand: (Patient-Rptd) 15 days   Patient requested: (Patient-Rptd) Delivery   Delivery date: 11/01/24  Verified address: [address] [place] [address]  Medication will be filled on 10/31/24

## 2024-10-26 ENCOUNTER — Other Ambulatory Visit: Payer: Self-pay

## 2024-10-26 ENCOUNTER — Telehealth: Payer: Self-pay

## 2024-10-26 NOTE — Telephone Encounter (Signed)
 Good Morning, Wanted to provide courteous notification. In reviewing Genetic patients for Fri Oct 28 2024; found patient cancelled appt through scheduling. Reason: can't afford at this time. Thank you

## 2024-10-28 ENCOUNTER — Inpatient Hospital Stay

## 2024-10-31 ENCOUNTER — Other Ambulatory Visit: Payer: Self-pay

## 2024-10-31 LAB — GENECONNECT MOLECULAR SCREEN

## 2024-11-01 ENCOUNTER — Telehealth: Payer: Self-pay | Admitting: Medical Genetics

## 2024-11-03 NOTE — Telephone Encounter (Signed)
 [facility]  11/03/2024 1:05 PM  Confirmed I was speaking with Cody [name] [phone] by using name and DOB. Informed participant the reason for this call is to follow-up on a recent sample the participant provided at one of the [facility] lab locations. Informed participant the test was not able to be completed with this sample and apologized for the inconvenience. Participant was requested to provide a new sample at one of our participating labs at no cost so that participant can continue participation and receive test results. Informed participant they do not need to be fasting and if there are other samples that need to be drawn, they can be done at the same visit. Participant has not had a blood transfusion or blood product in the last 30 days. Participant does not wish to provide another sample. Participant was provided the [employer] program website to learn why this may have happened. Participant was thanked for their time and continued support of the above study. Participant requested to withdraw from [facility].

## 2024-11-04 ENCOUNTER — Other Ambulatory Visit: Payer: Self-pay

## 2024-11-04 DIAGNOSIS — C61 Malignant neoplasm of prostate: Secondary | ICD-10-CM

## 2024-11-04 MED ORDER — ORGOVYX 120 MG PO TABS
120.0000 mg | ORAL_TABLET | Freq: Every day | ORAL | 0 refills | Status: AC
  Filled 2024-11-04 – 2024-11-07 (×2): qty 30, 30d supply, fill #0

## 2024-11-07 ENCOUNTER — Inpatient Hospital Stay

## 2024-11-07 ENCOUNTER — Other Ambulatory Visit: Payer: Self-pay

## 2024-11-07 ENCOUNTER — Other Ambulatory Visit (HOSPITAL_COMMUNITY): Payer: Self-pay

## 2024-11-08 ENCOUNTER — Telehealth: Payer: Self-pay

## 2024-11-08 ENCOUNTER — Other Ambulatory Visit: Payer: Self-pay

## 2024-11-08 NOTE — Telephone Encounter (Signed)
 Patient left a voicemail needing to reschedule his appts on 2/4 because he needs an afternoon. I called and got him rescheduled for 2/23 in the afternoon and he is aware.

## 2024-11-09 ENCOUNTER — Inpatient Hospital Stay: Attending: Hematology

## 2024-11-09 ENCOUNTER — Inpatient Hospital Stay

## 2024-11-09 ENCOUNTER — Other Ambulatory Visit: Payer: Self-pay

## 2024-11-09 ENCOUNTER — Other Ambulatory Visit (HOSPITAL_COMMUNITY): Payer: Self-pay

## 2024-11-09 NOTE — Progress Notes (Signed)
 Specialty Pharmacy Refill Coordination Note  Spoke with Cody [name]  Cody [name] is a 69 y.o. male contacted today regarding refills of specialty medication(s) Relugolix  (Orgovyx )  Doses on hand: 6  Patient requested: Delivery   Delivery date: 11/11/24   Verified address: [address] [place] [address]  Medication will be filled on 11/10/24

## 2024-11-10 ENCOUNTER — Other Ambulatory Visit: Payer: Self-pay

## 2024-11-28 ENCOUNTER — Inpatient Hospital Stay: Attending: Hematology

## 2024-11-28 ENCOUNTER — Inpatient Hospital Stay
